# Patient Record
Sex: Male | Born: 1950
Health system: Southern US, Community
[De-identification: ages and names within clinical notes are randomized; demographics above are authoritative.]

## PROBLEM LIST (undated history)

## (undated) DIAGNOSIS — Z5189 Encounter for other specified aftercare: Secondary | ICD-10-CM

## (undated) DIAGNOSIS — J40 Bronchitis, not specified as acute or chronic: Secondary | ICD-10-CM

## (undated) DIAGNOSIS — E7800 Pure hypercholesterolemia, unspecified: Secondary | ICD-10-CM

## (undated) DIAGNOSIS — E119 Type 2 diabetes mellitus without complications: Secondary | ICD-10-CM

## (undated) DIAGNOSIS — I10 Essential (primary) hypertension: Secondary | ICD-10-CM

## (undated) DIAGNOSIS — I639 Cerebral infarction, unspecified: Secondary | ICD-10-CM

## (undated) DIAGNOSIS — G2581 Restless legs syndrome: Secondary | ICD-10-CM

## (undated) DIAGNOSIS — IMO0001 Reserved for inherently not codable concepts without codable children: Secondary | ICD-10-CM

## (undated) DIAGNOSIS — J449 Chronic obstructive pulmonary disease, unspecified: Secondary | ICD-10-CM

## (undated) DIAGNOSIS — G4730 Sleep apnea, unspecified: Secondary | ICD-10-CM

## (undated) HISTORY — PX: EYE SURGERY: SHX253

---

## 2001-04-23 ENCOUNTER — Encounter: Payer: Self-pay | Admitting: Internal Medicine

## 2001-04-23 ENCOUNTER — Ambulatory Visit (HOSPITAL_COMMUNITY): Admission: RE | Admit: 2001-04-23 | Discharge: 2001-04-23 | Payer: Self-pay | Admitting: Internal Medicine

## 2003-10-13 ENCOUNTER — Ambulatory Visit (HOSPITAL_COMMUNITY): Admission: RE | Admit: 2003-10-13 | Discharge: 2003-10-13 | Payer: Self-pay | Admitting: Internal Medicine

## 2003-10-28 ENCOUNTER — Encounter: Admission: RE | Admit: 2003-10-28 | Discharge: 2003-10-28 | Payer: Self-pay | Admitting: Internal Medicine

## 2003-11-12 ENCOUNTER — Encounter: Admission: RE | Admit: 2003-11-12 | Discharge: 2003-11-12 | Payer: Self-pay | Admitting: Internal Medicine

## 2003-12-11 ENCOUNTER — Encounter (HOSPITAL_COMMUNITY): Admission: RE | Admit: 2003-12-11 | Discharge: 2004-01-10 | Payer: Self-pay | Admitting: Neurosurgery

## 2004-12-01 ENCOUNTER — Ambulatory Visit (HOSPITAL_COMMUNITY): Admission: RE | Admit: 2004-12-01 | Discharge: 2004-12-01 | Payer: Self-pay | Admitting: Orthopedic Surgery

## 2004-12-01 ENCOUNTER — Ambulatory Visit (HOSPITAL_BASED_OUTPATIENT_CLINIC_OR_DEPARTMENT_OTHER): Admission: RE | Admit: 2004-12-01 | Discharge: 2004-12-01 | Payer: Self-pay | Admitting: Orthopedic Surgery

## 2005-01-17 ENCOUNTER — Ambulatory Visit: Payer: Self-pay | Admitting: Internal Medicine

## 2005-01-20 ENCOUNTER — Ambulatory Visit: Payer: Self-pay | Admitting: Internal Medicine

## 2005-01-27 ENCOUNTER — Ambulatory Visit (HOSPITAL_COMMUNITY): Admission: RE | Admit: 2005-01-27 | Discharge: 2005-01-27 | Payer: Self-pay | Admitting: Internal Medicine

## 2005-02-27 ENCOUNTER — Ambulatory Visit (HOSPITAL_COMMUNITY): Admission: RE | Admit: 2005-02-27 | Discharge: 2005-02-27 | Payer: Self-pay | Admitting: Internal Medicine

## 2005-04-05 ENCOUNTER — Ambulatory Visit (HOSPITAL_COMMUNITY): Admission: RE | Admit: 2005-04-05 | Discharge: 2005-04-05 | Payer: Self-pay | Admitting: Internal Medicine

## 2005-04-06 ENCOUNTER — Ambulatory Visit: Payer: Self-pay | Admitting: Internal Medicine

## 2005-04-20 ENCOUNTER — Encounter: Admission: RE | Admit: 2005-04-20 | Discharge: 2005-04-20 | Payer: Self-pay | Admitting: Internal Medicine

## 2005-04-28 ENCOUNTER — Ambulatory Visit (HOSPITAL_COMMUNITY): Admission: RE | Admit: 2005-04-28 | Discharge: 2005-04-28 | Payer: Self-pay | Admitting: Internal Medicine

## 2005-05-03 ENCOUNTER — Ambulatory Visit (HOSPITAL_COMMUNITY): Admission: RE | Admit: 2005-05-03 | Discharge: 2005-05-03 | Payer: Self-pay | Admitting: Internal Medicine

## 2005-05-04 ENCOUNTER — Encounter: Admission: RE | Admit: 2005-05-04 | Discharge: 2005-05-04 | Payer: Self-pay | Admitting: Internal Medicine

## 2005-07-06 ENCOUNTER — Encounter: Admission: RE | Admit: 2005-07-06 | Discharge: 2005-07-06 | Payer: Self-pay | Admitting: Orthopedic Surgery

## 2005-08-10 ENCOUNTER — Inpatient Hospital Stay (HOSPITAL_COMMUNITY): Admission: RE | Admit: 2005-08-10 | Discharge: 2005-08-15 | Payer: Self-pay | Admitting: Orthopedic Surgery

## 2005-08-10 HISTORY — PX: TOTAL HIP ARTHROPLASTY: SHX124

## 2005-08-25 ENCOUNTER — Inpatient Hospital Stay (HOSPITAL_COMMUNITY): Admission: EM | Admit: 2005-08-25 | Discharge: 2005-08-31 | Payer: Self-pay | Admitting: Emergency Medicine

## 2005-08-27 ENCOUNTER — Ambulatory Visit: Payer: Self-pay | Admitting: Infectious Diseases

## 2005-08-28 ENCOUNTER — Encounter: Payer: Self-pay | Admitting: Orthopedic Surgery

## 2005-09-01 ENCOUNTER — Ambulatory Visit: Payer: Self-pay | Admitting: Internal Medicine

## 2005-09-03 ENCOUNTER — Emergency Department (HOSPITAL_COMMUNITY): Admission: EM | Admit: 2005-09-03 | Discharge: 2005-09-04 | Payer: Self-pay | Admitting: Emergency Medicine

## 2005-09-13 ENCOUNTER — Ambulatory Visit: Payer: Self-pay | Admitting: Internal Medicine

## 2005-09-14 ENCOUNTER — Ambulatory Visit: Payer: Self-pay | Admitting: Emergency Medicine

## 2005-09-14 ENCOUNTER — Inpatient Hospital Stay (HOSPITAL_COMMUNITY): Admission: EM | Admit: 2005-09-14 | Discharge: 2005-09-25 | Payer: Self-pay | Admitting: Emergency Medicine

## 2005-09-15 ENCOUNTER — Encounter (INDEPENDENT_AMBULATORY_CARE_PROVIDER_SITE_OTHER): Payer: Self-pay | Admitting: *Deleted

## 2005-09-20 ENCOUNTER — Ambulatory Visit: Payer: Self-pay | Admitting: Cardiology

## 2005-09-20 ENCOUNTER — Encounter: Payer: Self-pay | Admitting: Cardiology

## 2005-09-22 ENCOUNTER — Ambulatory Visit: Payer: Self-pay | Admitting: Internal Medicine

## 2005-10-05 ENCOUNTER — Ambulatory Visit: Payer: Self-pay | Admitting: Internal Medicine

## 2005-10-17 ENCOUNTER — Ambulatory Visit: Payer: Self-pay | Admitting: Internal Medicine

## 2005-10-27 ENCOUNTER — Encounter: Admission: RE | Admit: 2005-10-27 | Discharge: 2005-10-27 | Payer: Self-pay | Admitting: Orthopedic Surgery

## 2005-11-30 HISTORY — PX: TOTAL HIP ARTHROPLASTY: SHX124

## 2005-12-14 ENCOUNTER — Inpatient Hospital Stay (HOSPITAL_COMMUNITY): Admission: RE | Admit: 2005-12-14 | Discharge: 2005-12-18 | Payer: Self-pay | Admitting: Orthopedic Surgery

## 2006-01-31 ENCOUNTER — Encounter (HOSPITAL_COMMUNITY): Admission: RE | Admit: 2006-01-31 | Discharge: 2006-03-02 | Payer: Self-pay | Admitting: Orthopedic Surgery

## 2006-02-05 ENCOUNTER — Ambulatory Visit: Payer: Self-pay | Admitting: Pulmonary Disease

## 2006-02-06 ENCOUNTER — Encounter: Admission: RE | Admit: 2006-02-06 | Discharge: 2006-02-06 | Payer: Self-pay | Admitting: Internal Medicine

## 2006-02-11 ENCOUNTER — Ambulatory Visit: Admission: RE | Admit: 2006-02-11 | Discharge: 2006-02-11 | Payer: Self-pay | Admitting: Internal Medicine

## 2006-03-16 ENCOUNTER — Ambulatory Visit: Admission: RE | Admit: 2006-03-16 | Discharge: 2006-03-16 | Payer: Self-pay | Admitting: Orthopedic Surgery

## 2006-04-18 ENCOUNTER — Ambulatory Visit: Payer: Self-pay | Admitting: Pulmonary Disease

## 2006-05-28 ENCOUNTER — Ambulatory Visit: Payer: Self-pay | Admitting: Pulmonary Disease

## 2006-08-10 ENCOUNTER — Ambulatory Visit (HOSPITAL_COMMUNITY): Admission: RE | Admit: 2006-08-10 | Discharge: 2006-08-10 | Payer: Self-pay | Admitting: Internal Medicine

## 2006-08-17 ENCOUNTER — Ambulatory Visit (HOSPITAL_COMMUNITY): Admission: RE | Admit: 2006-08-17 | Discharge: 2006-08-17 | Payer: Self-pay | Admitting: Internal Medicine

## 2006-09-09 IMAGING — CR DG CHEST 1V PORT
1 series · 1 of 1 positions shown · non-contrast
Comparison: none

CLINICAL DATA: Anaphylaxis.  
PORTABLE CHEST ? 1 VIEW ? 09/19/05 ? 8888 HOURS:

[view not recorded]
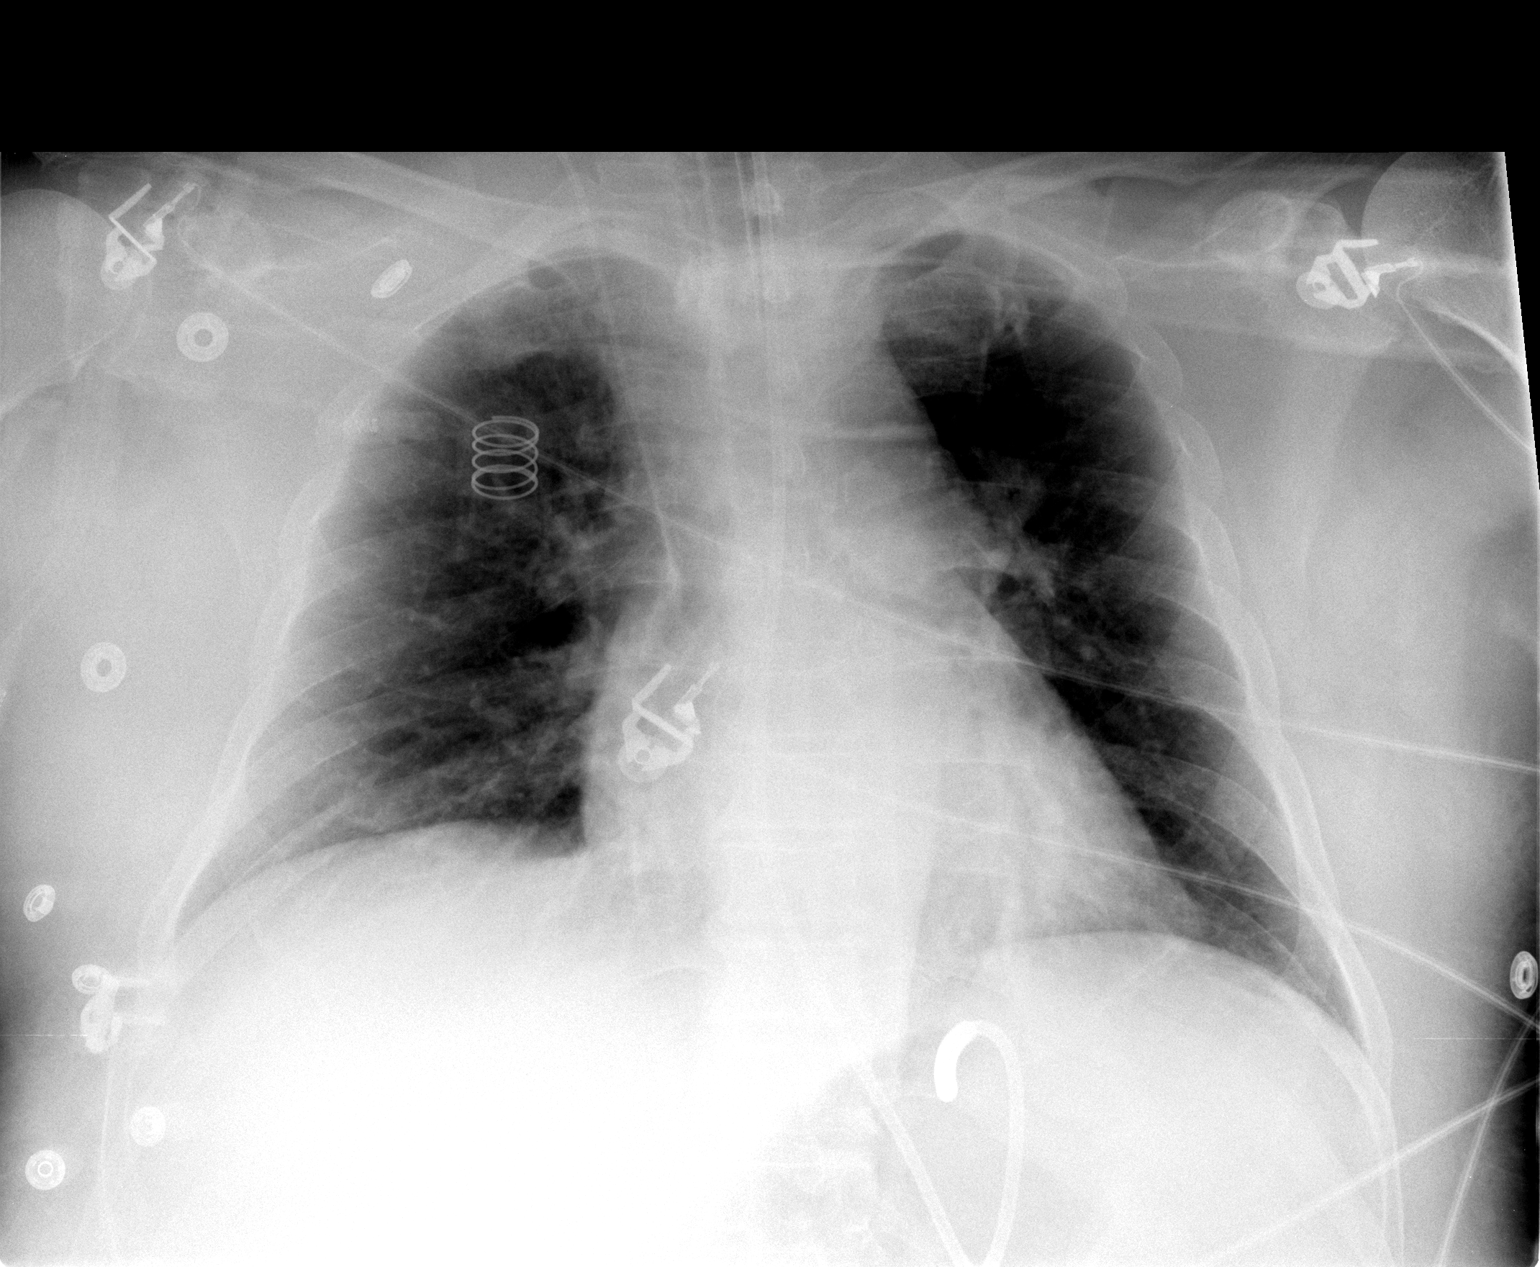

[1 of 1 positions shown; findings below may reference images not displayed]

FINDINGS: Endotracheal tube tip is 5.7 cm above the carina.  Panda tube tip curled within the gastric fundus.  Right central line tip mid to distal superior vena cava.  No pneumothorax.  Cardiomegaly and central pulmonary vascular prominence.  No segmental region of consolidation.
IMPRESSION: Cardiomegaly and mild pulmonary vascular prominence appearing similar to prior exam.

## 2007-02-15 ENCOUNTER — Telehealth (INDEPENDENT_AMBULATORY_CARE_PROVIDER_SITE_OTHER): Payer: Self-pay | Admitting: *Deleted

## 2007-02-15 DIAGNOSIS — G4733 Obstructive sleep apnea (adult) (pediatric): Secondary | ICD-10-CM | POA: Insufficient documentation

## 2007-03-18 ENCOUNTER — Telehealth (INDEPENDENT_AMBULATORY_CARE_PROVIDER_SITE_OTHER): Payer: Self-pay | Admitting: *Deleted

## 2007-04-17 ENCOUNTER — Telehealth (INDEPENDENT_AMBULATORY_CARE_PROVIDER_SITE_OTHER): Payer: Self-pay | Admitting: *Deleted

## 2007-05-31 ENCOUNTER — Ambulatory Visit: Payer: Self-pay | Admitting: Pulmonary Disease

## 2007-05-31 DIAGNOSIS — G2581 Restless legs syndrome: Secondary | ICD-10-CM

## 2007-12-06 ENCOUNTER — Ambulatory Visit: Payer: Self-pay | Admitting: Pulmonary Disease

## 2010-02-20 ENCOUNTER — Encounter: Payer: Self-pay | Admitting: Internal Medicine

## 2010-03-09 ENCOUNTER — Other Ambulatory Visit (HOSPITAL_COMMUNITY): Payer: Self-pay | Admitting: Internal Medicine

## 2010-03-09 ENCOUNTER — Encounter (HOSPITAL_COMMUNITY): Payer: Self-pay

## 2010-03-09 ENCOUNTER — Ambulatory Visit (HOSPITAL_COMMUNITY)
Admission: RE | Admit: 2010-03-09 | Discharge: 2010-03-09 | Disposition: A | Discharge status: Home / Self Care | Payer: Medicare Other | Source: Ambulatory Visit | Attending: Internal Medicine | Admitting: Internal Medicine

## 2010-03-09 DIAGNOSIS — R059 Cough, unspecified: Secondary | ICD-10-CM | POA: Insufficient documentation

## 2010-03-09 DIAGNOSIS — J449 Chronic obstructive pulmonary disease, unspecified: Secondary | ICD-10-CM | POA: Insufficient documentation

## 2010-03-09 DIAGNOSIS — R05 Cough: Secondary | ICD-10-CM

## 2010-03-09 DIAGNOSIS — J4489 Other specified chronic obstructive pulmonary disease: Secondary | ICD-10-CM | POA: Insufficient documentation

## 2010-03-09 HISTORY — DX: Essential (primary) hypertension: I10

## 2010-04-08 ENCOUNTER — Emergency Department (HOSPITAL_COMMUNITY): Payer: Medicare Other

## 2010-04-08 ENCOUNTER — Emergency Department (HOSPITAL_COMMUNITY)
Admission: EM | Admit: 2010-04-08 | Discharge: 2010-04-09 | Disposition: A | Discharge status: Other Acute Inpatient Hospital | Payer: Medicare Other | Source: Home / Self Care | Attending: Emergency Medicine | Admitting: Emergency Medicine

## 2010-04-08 DIAGNOSIS — S2249XA Multiple fractures of ribs, unspecified side, initial encounter for closed fracture: Secondary | ICD-10-CM | POA: Insufficient documentation

## 2010-04-08 DIAGNOSIS — I10 Essential (primary) hypertension: Secondary | ICD-10-CM | POA: Insufficient documentation

## 2010-04-08 DIAGNOSIS — R079 Chest pain, unspecified: Secondary | ICD-10-CM | POA: Insufficient documentation

## 2010-04-08 DIAGNOSIS — R0602 Shortness of breath: Secondary | ICD-10-CM | POA: Insufficient documentation

## 2010-04-08 DIAGNOSIS — IMO0002 Reserved for concepts with insufficient information to code with codable children: Secondary | ICD-10-CM | POA: Insufficient documentation

## 2010-04-08 DIAGNOSIS — H1130 Conjunctival hemorrhage, unspecified eye: Secondary | ICD-10-CM | POA: Insufficient documentation

## 2010-04-08 DIAGNOSIS — Y929 Unspecified place or not applicable: Secondary | ICD-10-CM | POA: Insufficient documentation

## 2010-04-08 DIAGNOSIS — S27329A Contusion of lung, unspecified, initial encounter: Secondary | ICD-10-CM | POA: Insufficient documentation

## 2010-04-08 DIAGNOSIS — M542 Cervicalgia: Secondary | ICD-10-CM | POA: Insufficient documentation

## 2010-04-08 DIAGNOSIS — S066X0A Traumatic subarachnoid hemorrhage without loss of consciousness, initial encounter: Secondary | ICD-10-CM | POA: Insufficient documentation

## 2010-04-08 DIAGNOSIS — S0510XA Contusion of eyeball and orbital tissues, unspecified eye, initial encounter: Secondary | ICD-10-CM | POA: Insufficient documentation

## 2010-04-08 DIAGNOSIS — S12100A Unspecified displaced fracture of second cervical vertebra, initial encounter for closed fracture: Secondary | ICD-10-CM | POA: Insufficient documentation

## 2010-04-08 DIAGNOSIS — S20219A Contusion of unspecified front wall of thorax, initial encounter: Secondary | ICD-10-CM | POA: Insufficient documentation

## 2010-04-08 LAB — DIFFERENTIAL
Basophils Absolute: 0 10*3/uL (ref 0.0–0.1)
Basophils Relative: 0 % (ref 0–1)
Eosinophils Absolute: 0.1 10*3/uL (ref 0.0–0.7)
Eosinophils Relative: 1 % (ref 0–5)
Lymphocytes Relative: 16 % (ref 12–46)

## 2010-04-08 LAB — BASIC METABOLIC PANEL
Calcium: 8.6 mg/dL (ref 8.4–10.5)
Creatinine, Ser: 0.96 mg/dL (ref 0.4–1.5)
GFR calc Af Amer: >60 mL/min (ref >60)
GFR calc non Af Amer: >60 mL/min (ref >60)
Sodium: 139 mEq/L (ref 135–145)

## 2010-04-08 LAB — CBC
MCHC: 33 g/dL (ref 30.0–36.0)
Platelets: 240 10*3/uL (ref 150–400)
RDW: 15.2 % (ref 11.5–15.5)
WBC: 17 10*3/uL — ABNORMAL HIGH (ref 4.0–10.5)

## 2010-04-08 MED ORDER — IOHEXOL 300 MG/ML  SOLN
100.0000 mL | Freq: Once | INTRAMUSCULAR | Status: AC | PRN
  Administered 2010-04-08: 100 mL via INTRAVENOUS

## 2010-04-09 ENCOUNTER — Inpatient Hospital Stay (HOSPITAL_COMMUNITY): Payer: Medicare Other

## 2010-04-09 ENCOUNTER — Inpatient Hospital Stay (HOSPITAL_COMMUNITY)
Admission: EM | Admit: 2010-04-09 | Discharge: 2010-04-13 | DRG: 964 | Disposition: A | Discharge status: Home / Self Care | Payer: Medicare Other | Source: Other Acute Inpatient Hospital | Attending: General Surgery | Admitting: General Surgery

## 2010-04-09 DIAGNOSIS — F1010 Alcohol abuse, uncomplicated: Secondary | ICD-10-CM | POA: Diagnosis present

## 2010-04-09 DIAGNOSIS — S0083XA Contusion of other part of head, initial encounter: Secondary | ICD-10-CM | POA: Diagnosis present

## 2010-04-09 DIAGNOSIS — L255 Unspecified contact dermatitis due to plants, except food: Secondary | ICD-10-CM | POA: Diagnosis present

## 2010-04-09 DIAGNOSIS — Z7982 Long term (current) use of aspirin: Secondary | ICD-10-CM

## 2010-04-09 DIAGNOSIS — Z96649 Presence of unspecified artificial hip joint: Secondary | ICD-10-CM

## 2010-04-09 DIAGNOSIS — S12100A Unspecified displaced fracture of second cervical vertebra, initial encounter for closed fracture: Secondary | ICD-10-CM | POA: Diagnosis present

## 2010-04-09 DIAGNOSIS — S066X0A Traumatic subarachnoid hemorrhage without loss of consciousness, initial encounter: Principal | ICD-10-CM | POA: Diagnosis present

## 2010-04-09 DIAGNOSIS — S2249XA Multiple fractures of ribs, unspecified side, initial encounter for closed fracture: Secondary | ICD-10-CM | POA: Diagnosis present

## 2010-04-09 DIAGNOSIS — E785 Hyperlipidemia, unspecified: Secondary | ICD-10-CM | POA: Diagnosis present

## 2010-04-09 DIAGNOSIS — S0003XA Contusion of scalp, initial encounter: Secondary | ICD-10-CM | POA: Diagnosis present

## 2010-04-09 DIAGNOSIS — Z8673 Personal history of transient ischemic attack (TIA), and cerebral infarction without residual deficits: Secondary | ICD-10-CM

## 2010-04-09 DIAGNOSIS — T622X1A Toxic effect of other ingested (parts of) plant(s), accidental (unintentional), initial encounter: Secondary | ICD-10-CM | POA: Diagnosis present

## 2010-04-09 DIAGNOSIS — S27329A Contusion of lung, unspecified, initial encounter: Secondary | ICD-10-CM | POA: Diagnosis present

## 2010-04-09 DIAGNOSIS — E119 Type 2 diabetes mellitus without complications: Secondary | ICD-10-CM | POA: Diagnosis present

## 2010-04-09 DIAGNOSIS — S32009A Unspecified fracture of unspecified lumbar vertebra, initial encounter for closed fracture: Secondary | ICD-10-CM | POA: Diagnosis present

## 2010-04-09 DIAGNOSIS — K219 Gastro-esophageal reflux disease without esophagitis: Secondary | ICD-10-CM | POA: Diagnosis present

## 2010-04-09 DIAGNOSIS — I10 Essential (primary) hypertension: Secondary | ICD-10-CM | POA: Diagnosis present

## 2010-04-09 DIAGNOSIS — D62 Acute posthemorrhagic anemia: Secondary | ICD-10-CM | POA: Diagnosis not present

## 2010-04-09 DIAGNOSIS — G4733 Obstructive sleep apnea (adult) (pediatric): Secondary | ICD-10-CM | POA: Diagnosis present

## 2010-04-09 LAB — CBC
Hemoglobin: 11.8 g/dL — ABNORMAL LOW (ref 13.0–17.0)
MCH: 27.9 pg (ref 26.0–34.0)
Platelets: 212 10*3/uL (ref 150–400)
RBC: 4.23 MIL/uL (ref 4.22–5.81)
WBC: 11.4 10*3/uL — ABNORMAL HIGH (ref 4.0–10.5)

## 2010-04-09 LAB — HEMOGLOBIN A1C
Hgb A1c MFr Bld: 6.6 % — ABNORMAL HIGH (ref <5.7)
Mean Plasma Glucose: 143 mg/dL — ABNORMAL HIGH (ref <117)

## 2010-04-09 LAB — GLUCOSE, CAPILLARY: Glucose-Capillary: 113 mg/dL — ABNORMAL HIGH (ref 70–99)

## 2010-04-09 LAB — BASIC METABOLIC PANEL
CO2: 25 mEq/L (ref 19–32)
Chloride: 109 mEq/L (ref 96–112)
Creatinine, Ser: 0.73 mg/dL (ref 0.4–1.5)
GFR calc Af Amer: >60 mL/min (ref >60)
Potassium: 3.6 mEq/L (ref 3.5–5.1)

## 2010-04-09 LAB — URINALYSIS, ROUTINE W REFLEX MICROSCOPIC
Bilirubin Urine: NEGATIVE
Glucose, UA: NEGATIVE mg/dL
Ketones, ur: NEGATIVE mg/dL
Ketones, ur: 15 mg/dL — AB
Leukocytes, UA: NEGATIVE
Leukocytes, UA: NEGATIVE
Nitrite: NEGATIVE
Nitrite: NEGATIVE
Protein, ur: 30 mg/dL — AB
Protein, ur: NEGATIVE mg/dL
pH: 5.5 (ref 5.0–8.0)

## 2010-04-09 LAB — URINE MICROSCOPIC-ADD ON

## 2010-04-09 LAB — PROTIME-INR: INR: 0.89 (ref 0.00–1.49)

## 2010-04-10 LAB — GLUCOSE, CAPILLARY: Glucose-Capillary: 127 mg/dL — ABNORMAL HIGH (ref 70–99)

## 2010-04-11 ENCOUNTER — Inpatient Hospital Stay (HOSPITAL_COMMUNITY): Payer: Medicare Other

## 2010-04-11 LAB — BASIC METABOLIC PANEL
CO2: 25 mEq/L (ref 19–32)
GFR calc non Af Amer: >60 mL/min (ref >60)
Glucose, Bld: 123 mg/dL — ABNORMAL HIGH (ref 70–99)
Potassium: 3.3 mEq/L — ABNORMAL LOW (ref 3.5–5.1)
Sodium: 140 mEq/L (ref 135–145)

## 2010-04-11 LAB — CBC
HCT: 34 % — ABNORMAL LOW (ref 39.0–52.0)
Hemoglobin: 11.3 g/dL — ABNORMAL LOW (ref 13.0–17.0)
RDW: 15.1 % (ref 11.5–15.5)
WBC: 8 10*3/uL (ref 4.0–10.5)

## 2010-04-11 LAB — GLUCOSE, CAPILLARY
Glucose-Capillary: 137 mg/dL — ABNORMAL HIGH (ref 70–99)
Glucose-Capillary: 124 mg/dL — ABNORMAL HIGH (ref 70–99)
Glucose-Capillary: 104 mg/dL — ABNORMAL HIGH (ref 70–99)
Glucose-Capillary: 168 mg/dL — ABNORMAL HIGH (ref 70–99)

## 2010-04-12 ENCOUNTER — Inpatient Hospital Stay (HOSPITAL_COMMUNITY): Payer: Medicare Other

## 2010-04-12 LAB — GLUCOSE, CAPILLARY
Glucose-Capillary: 144 mg/dL — ABNORMAL HIGH (ref 70–99)
Glucose-Capillary: 122 mg/dL — ABNORMAL HIGH (ref 70–99)
Glucose-Capillary: 121 mg/dL — ABNORMAL HIGH (ref 70–99)
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
## 2010-04-13 ENCOUNTER — Inpatient Hospital Stay (HOSPITAL_COMMUNITY): Payer: Medicare Other

## 2010-04-13 LAB — GLUCOSE, CAPILLARY
Glucose-Capillary: 136 mg/dL — ABNORMAL HIGH (ref 70–99)
Glucose-Capillary: 136 mg/dL — ABNORMAL HIGH (ref 70–99)
Glucose-Capillary: 132 mg/dL — ABNORMAL HIGH (ref 70–99)
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
## 2010-04-28 ENCOUNTER — Other Ambulatory Visit: Payer: Self-pay | Admitting: Neurosurgery

## 2010-04-28 ENCOUNTER — Ambulatory Visit
Admission: RE | Admit: 2010-04-28 | Discharge: 2010-04-28 | Disposition: A | Discharge status: Home / Self Care | Payer: Medicare Other | Source: Ambulatory Visit | Attending: Neurosurgery | Admitting: Neurosurgery

## 2010-04-28 DIAGNOSIS — S12100A Unspecified displaced fracture of second cervical vertebra, initial encounter for closed fracture: Secondary | ICD-10-CM
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
## 2010-05-19 ENCOUNTER — Ambulatory Visit
Admission: RE | Admit: 2010-05-19 | Discharge: 2010-05-19 | Disposition: A | Discharge status: Home / Self Care | Payer: Medicare Other | Source: Ambulatory Visit | Attending: Neurosurgery | Admitting: Neurosurgery

## 2010-05-19 ENCOUNTER — Other Ambulatory Visit: Payer: Self-pay | Admitting: Neurosurgery

## 2010-05-19 DIAGNOSIS — M542 Cervicalgia: Secondary | ICD-10-CM

## 2010-06-16 ENCOUNTER — Ambulatory Visit
Admission: RE | Admit: 2010-06-16 | Discharge: 2010-06-16 | Disposition: A | Discharge status: Home / Self Care | Payer: Medicare Other | Source: Ambulatory Visit | Attending: Neurosurgery | Admitting: Neurosurgery

## 2010-06-16 DIAGNOSIS — M542 Cervicalgia: Secondary | ICD-10-CM

## 2010-06-17 NOTE — Op Note (Signed)
NAME:  GLEN, KESINGER NO.:  [id number]   MEDICAL RECORD NO.:  [id number]          PATIENT TYPE:  AMB   LOCATION:  DAY                           FACILITY:  [facility]   PHYSICIAN:  Lionel December, M.D.    DATE OF BIRTH:  13-Feb-1950   DATE OF PROCEDURE:  DATE OF DISCHARGE:                                 OPERATIVE REPORT   PROCEDURE:  Colonoscopy with terminal ileoscopy.   INDICATION:  Lucas Clark is 60 year old Caucasian male who was recently found to  have heme-positive stools. He has had chronic diarrhea of at least four  years duration, but has declined lined evaluation until now. He states he  takes 4 to 5 tablets of OTC Imodium b.i.d. to control his frequency of bowel  movements so that he can work. He states just before he was noted to have  heme-positive stools. He did have hematochezia. He denies frank and rectal  bleeding or melena. Procedures were reviewed the patient, informed consent  was obtained.   MEDICINES FOR CONSCIOUS SEDATION:  Demerol 50 mg IV, Versed 8 mg IV.   FINDINGS:  Procedure performed in endoscopy suite. The patient's vital signs  and O2 saturation were monitored during the procedure and remained stable.  The patient was placed in the left lateral position and rectal examination  performed. No abnormality noted on external or digital exam. Olympus  videoscope was placed in rectum and advanced under vision into sigmoid colon  and beyond. Preparation was excellent. Scope was passed into cecum which was  identified by appendiceal orifice and ileocecal valve. A short segment of TI  was also examined and was normal. As the scope was withdrawn, colonic mucosa  was carefully examined, biopsies were taken in a random fashion from  descending and sigmoid colon and submitted in one container. There there  were focal areas of erythema and edema in the sigmoid colon. Biopsy was  taken from these areas and submitted in a separate container. Rectal mucosa  was  normal. Scope was retroflexed to examine anorectal junction which was  unremarkable. Endoscope was straightened and withdrawn. The patient  tolerated the procedure well.   FINAL DIAGNOSIS:  1.  Normal terminal ileum.  2.  Focal or patchy erythema at sigmoid colon, but no erosions or ulcers      noted. I am not sure that this is mild colitis; and I doubt very much      that this will explain his chronic diarrhea. Biopsy taken from these      areas as well as normal appearing mucosa and submitted in separate      containers.   RECOMMENDATIONS:  1.  Levbid 1 tablet every morning.  2.  Fiber of choice 2 tablets at bedtime.  3.  He can continue his Imodium like he is.  As soon as soon as his stools      are formed of semi-formed, I would like      for him to gradually decrease the dose of Imodium and see how he does.  4.  He will keep a stool diary.  5.  I will be contacting with biopsy results and further recommendations.      Lionel December, M.D.  Electronically Signed     NR/MEDQ  D:  01/27/2005  T:  01/27/2005  Job:  [id number]   cc:   Kingsley Callander. Ouida Sills, MD  Fax: [phone]

## 2010-06-17 NOTE — Discharge Summary (Signed)
NAME:  Lucas Clark, ROCKETT NO.:  [id number]   MEDICAL RECORD NO.:  [id number]          PATIENT TYPE:  INP   LOCATION:  5015                         FACILITY:  [facility]   PHYSICIAN:  Loreta Ave, M.D. DATE OF BIRTH:  1950/05/14   DATE OF ADMISSION:  08/10/2005  DATE OF DISCHARGE:  08/15/2005                                 DISCHARGE SUMMARY   FINAL DIAGNOSES:  1. 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  2. Hypopotassemia.   HISTORY OF PRESENT ILLNESS:  A 60 year old white male with history of end-  stage DJD and AVN right hip and chronic pain presents to our office for  preop evaluation for a total hip replacement.  He had progressively  worsening pain with failed response to conservative treatment.  Significant  decrease in his daily activities due to the ongoing complaint.   HOSPITAL COURSE:  August 10, 2005 the patient was taken to the [facility]  Operating Room and a right total hip replacement procedure performed.  Surgeon Loreta Ave, M.D. and assistant Genene Churn. Barry Dienes, PA-C.  Anesthesia general.  No surgical specimens.  EBL 500 mL.  There were no  surgical or anesthesia complications and the patient was transferred to  recovery in stable condition.  Pharmacy protocol Coumadin started.  August 11, 2005 the patient complained of headaches from the Dilaudid PCA.  Good pain  control right hip.  No complaints of chest pain or shortness of breath.  WBC  16.9, hemoglobin 12.9, hematocrit 37.5, platelets 295, glucose 175, INR 1.1.  The wound looked good, staples intact.  Calf nontender, neurovascularly  intact distally.  Discontinued Dilaudid PCA.  Dressing change.  August 12, 2005 the patient doing well with good pain control.  No complaints of chest  pain or shortness of breath or headaches.  Vital signs stable, afebrile.  The wound looked good with staples intact.  Moderate serous drainage but no  signs  of infection.  Calf nontender, neurovascularly intact.  Discontinued  Foley and hep-locked IV.  INR 1.1.  August 13, 2005 the patient doing well  with good pain control right hip.  No BM yet but positive flatus.  No  complaints of chest pain, shortness of breath, abdominal pain.  Progressing  well with PT.  Vital signs stable, afebrile.  Hemoglobin 10.7, potassium  2.6, sodium 133, chloride 90, CO2 34, BUN 9, creatinine 0.8, glucose 174,  INR 2.1.  Wound looked okay with mild to moderate serous drainage.  Again,  no signs of infection.  Staples intact.  Calf nontender, neurovascularly  intact.  Abdomen round, nontender and firm.  Magnesium citrate ordered.  KCl  60 mEq elixir x1 dose and then repeat labs.  Give another dose of potassium  40 mEq later if potassium still low.  August 14, 2005 doing well with good  pain control.  Good bowel movement.  No complaints of chest pain, shortness  of breath, abdominal pain.  Progressing with PT.  Temperature 99.3, pulse  81, respirations 16, blood pressure  124/77.  Hemoglobin 10.7, sodium 136,  potassium 2.9, chloride 93, CO2 33, BUN 12, creatinine 0.8, glucose 157, INR  pending.  Wound looked good.  Staples intact.  No signs of infection.  Calf  nontender.  Neurovascularly intact.  Abdomen soft, nontender and round.  He  continues with hypokalemia.  Ordered a medicine consult.  Checked a  hemoglobin A1c.  August 15, 2005 the patient doing well with no specific  complaints.  Vital signs stable, afebrile.  Hemoglobin 11.2, glucose 153,  INR 2.6, hemoglobin A1c 6.5.  Wound looked good, staples intact.  Again,  moderate serous drainage noted but no obvious infection.  Calf nontender,  neurovascularly intact.  Potassium normal.  He was cleared by medicine to  discharge home.   CONDITION:  Good and stable.   DISPOSITION ON DISCHARGE:  Home.   MEDICATIONS:  1. Percocet 5/325 one or two tabs p.o. q.4-6 h. p.r.n. for pain.  2. Robaxin 500 mg one tab p.o. q.6  h. p.r.n. for spasms.  3. Coumadin pharmacy protocol dose to be monitored by home health RN.  4. KCl 20 mEq p.o. daily.  5. Resume previous home meds.   INSTRUCTIONS:  The patient will work with home health PT and OT to improve  ambulation and strengthening.  Touchdown weightbearing until he is 6 weeks  postop.  Daily dressing changes as needed.  Coumadin x4 weeks postop for DVT  prophylaxis.  Follow up when he is 2 weeks postop for recheck.  Return  sooner if needed.      Genene Churn. Denton Meek.      Loreta Ave, M.D.  Electronically Signed    JMO/MEDQ  D:  11/15/2005  T:  11/15/2005  Job:  [id number]

## 2010-06-17 NOTE — Op Note (Signed)
NAMESHAYAAN, PARKE NO.:  [id number]   MEDICAL RECORD NO.:  [id number]          PATIENT TYPE:  AMB   LOCATION:  DSC                          FACILITY:  [facility]   PHYSICIAN:  Cindee Salt, M.D.       DATE OF BIRTH:  1950-04-12   DATE OF PROCEDURE:  12/01/2004  DATE OF DISCHARGE:                                 OPERATIVE REPORT   PREOPERATIVE DIAGNOSIS:  Carpal tunnel syndrome, left hand.   POSTOPERATIVE DIAGNOSIS:  Carpal tunnel syndrome, left hand.   OPERATION:  Decompression of left median nerve.   SURGEON:  Cindee Salt, M.D.   ASSISTANT:  None.   ANESTHESIA:  Forearm-based IV regional.   HISTORY:  The patient is a 60 year old male with a history of carpal tunnel  syndrome, EMG and nerve conductions positive, which has not responded to  conservative treatment.   PROCEDURE:  The patient was brought to the operating room where a forearm-  based IV regional anesthetic was carried out without difficulty.  He was  prepped using DuraPrep, supine position, left arm free.  A longitudinal  incision was made in the palm and carried down through subcutaneous tissue.  Bleeders were electrocauterized.  Palmar fascia was split, superficial  palmar arch identified, the flexor tendon to the ring and little finger  identified.  To the ulnar side of median nerve, the carpal retinaculum was  incised with sharp dissection.  A right-angle and Sewall retractor was  placed between skin and forearm fascia.  The fascia was released for  approximately a centimeter and a half proximal to the wrist crease under  direct vision.  The canal was explored.  Area of hyperemia to the nerve was  apparent.  No further lesions were identified.  Tenosynovial tissue was  moderately thickened.  The wound was irrigated and the skin was then closed  with interrupted 5-0 nylon sutures.  A sterile compressive dressing and  splint were applied.  The patient tolerated the procedure well was taken to  the recovery room for observation in satisfactory condition.  He is  discharged home to return to the [facility] [place] in 1 week on  Vicodin.           ______________________________  Cindee Salt, M.D.     GK/MEDQ  D:  12/01/2004  T:  12/02/2004  Job:  [id number]

## 2010-06-17 NOTE — Op Note (Signed)
NAME:  Lucas Clark, Lucas Clark NO.:  [id number]   MEDICAL RECORD NO.:  [id number]          PATIENT TYPE:  INP   LOCATION:  5015                         FACILITY:  [facility]   PHYSICIAN:  Loreta Ave, M.D. DATE OF BIRTH:  Sep 15, 1950   DATE OF PROCEDURE:  08/10/2005  DATE OF DISCHARGE:                                 OPERATIVE REPORT   PREOPERATIVE DIAGNOSES:  1.  End-stage degenerative arthritis, right hip, secondary to avascular      necrosis.  2.  Exogenous obesity.   POSTOPERATIVE DIAGNOSES:  1.  End-stage degenerative arthritis, right hip, secondary to avascular      necrosis.  2.  Exogenous obesity.   OPERATIVE PROCEDURE:  Right total hip replacement.  [employer]  Accolade prosthesis.  Press-Fit 52-mm acetabular component with a 32-mm  internal diameter ceramic component.  Femoral component size #3 with a 132-  degree neck angle, 35-mm neck, and a +4 x 32-mm ceramic head.   SURGEON:  Loreta Ave, MD   ASSISTANT:  Genene Churn. Barry Dienes, [place], present throughout the case.   ANESTHESIA:  General.   BLOOD LOSS:  500 mL   BLOOD GIVEN:  None.   SPECIMENS:  None.   CULTURES:  None.   COMPLICATIONS:  None.   DRESSING:  Soft compressive.   PROCEDURE:  Of note, because of the patient's exogenous obesity, all  positioning and the entire procedure was made much more difficult.  After  adequate anesthesia, turned to a lateral position, appropriate padding and  support.  Prepped and draped in the usual sterile fashion.  Incision along  the shaft of the femur extending posterosuperior.  Skin, subcutaneous tissue  divided.  Iliotibial band exposed, incised, Charnley retractor put in place.  Very difficult to keep the retractor in place because of his size, and it  kept coming out.  Adequate exposure, however, was achieved.  External  rotator capsule was taken down off the back of the intertrochanteric groove  of the femur.  Neurovascular structures identified  and protected.  Hip  dislocated posteriorly.  Femoral neck cut, longer than usual because of the  shape of his femoral neck.  This was at about 1-1/2 fingerbreadths above the  lesser trochanter in line with the definitive component.  Head removed.  Complete delamination, grade 4 changes from AVN.  Acetabulum exposed, with  degenerative changes there, as well.  Sequential reaming.  Appropriate  medial inferior placement.  Bleeding __________ throughout.  Sized for a 52-  mm component, which was hammered in place at 45 degrees of abduction, 20  degrees of anteversion.  Good capturing and fixation, which was augmented  with 2 screws through the cup, predrilled.  A 32-mm internal diameter  ceramic insert was then put in place.  Attention turned to the femur.  Reamed up to good fitting, choosing a #3 component.  After appropriate  trials with different neck lengths, different neck angles, and different  balls, I chose the 132-degree, 35-mm neck, +4 x 32-mm head.  With this, I  had a congruent stable reduction of flexion and extension, equal  leg  lengths, and good appearance.  Once the trials were completed, the  definitive component was seated.  The 32-mm ceramic head attached.  Hip  reduced.  Wound irrigated.  Iliotibial band was closed after the external  rotator and capsule repaired the back of the intertrochanteric groove  through drill holes using 5-0 wire and tied over a bony bridge.  Iliotibial  band closed with #1 Vicryl.  Skin and subcutaneous tissue with Vicryl and  staples.  __________ injection with Marcaine.  Sterile compressive dressing  applied.  Returned to supine position.  Abduction pillow placed.  Anesthesia  reversed.  Brought to recovery room.  Tolerated surgery well without  complications.      Loreta Ave, M.D.  Electronically Signed     DFM/MEDQ  D:  08/10/2005  T:  08/11/2005  Job:  [id number]

## 2010-06-17 NOTE — Assessment & Plan Note (Signed)
[facility]                             PULMONARY OFFICE NOTE   RAYLON, LAMSON                      MRN:          [id number]  DATE:04/18/2006                            DOB:          1950-10-18    SLEEP MEDICINE CONSULTATION   HISTORY OF PRESENT ILLNESS:  The patient is a 60 year old gentleman whom  I have been asked to see for obstructive sleep apnea.  The patient  underwent nocturnal polysomnography on February 11, 2006 where, by split-  night protocol, he was found to have very severe obstructive sleep apnea  with a respiratory disturbance index of 136 events per hour in the first  half of the night with O2 desaturation as low as 84%.  The patient was  then placed on CPAP and titrated to a final pressure of 10 cm with what  appears to be excellent controls.  The patient was also noted to have  very large numbers of leg jerks with significant sleep disruption.  The  patient states that he has been told that he has very loud snoring and  pauses in his breathing during sleep.  He typically gets to bed between  1 and 2 a.m. and gets up between 9 and 10 a.m.  He is not rested in the  mornings whenever he arises.  He has been noted to have dozing with  periods of inactivity, including watching television and also reading.  He does have significant sleepiness with driving.  The patient denies  classic symptoms of restless leg syndrome, but his friend states that he  moves his legs all night.  Of note, the patient's weight is up about 80  pounds over the last 8 to 9 months.   PAST MEDICAL HISTORY:  1. Significant for hypertension.  2. Dyslipidemia.  3. History of allergic rhinitis.   CURRENT MEDICATIONS:  1. Include Imodium AD p.r.n.  2. Naproxen p.r.n.  3. Trazodone 100 mg nightly.  4. Klonopin 1 mg t.i.d. p.r.n.   The patient is intolerant/allergic to LISINOPRIL, VANCOMYCIN, and  RIFAMPIN.   SOCIAL HISTORY:  He has a history of smoking  up to 4 packs per day for  32 years.  He has not smoked in 2 years.  The patient is widowed and has  children.   FAMILY HISTORY:  Remarkable for his mother having had asthma, as well as  kidney cancer.   REVIEW OF SYSTEMS:  As per the history of present illness.  Also see  patient intake form documented on the chart.   PHYSICAL EXAM:  GENERAL:  He is an obese male in no acute distress.  Blood pressure 140/84, pulse 80, temperature 98, weight 266 pounds, O2  saturation on room air is 94%.  HEENT:  Pupils are equal, round, and reactive to light and  accommodation.  Extraocular muscles are intact.  Nares show deviated  septum to the left.  Oropharynx shows significant increasing uvula and  palate with side-wall narrowing.  NECK:  Supple without JVD or lymphadenopathy, but it is quite large.  CHEST:  Totally clear.  CARDIAC:  Regular  rate and rhythm with no murmurs, rubs, or gallops.  ABDOMEN:  Soft and nontender with good bowel sounds.  GENITAL, RECTAL, AND BREASTS:  Not done and not indicated.  LOWER EXTREMITIES:  Shows 1+ edema.  Pulses are intact distally.  NEUROLOGIC:  He is alert and oriented, moves all 4 extremities.   IMPRESSION:  1. Very severe obstructive sleep apnea.  I have had a long      conversation with him about the quality of life issues, as well as      the long term cardiovascular health.  Really, given his severity of      his sleep apnea, weight loss and continuous positive airway      pressure are his best 2 options.  The patient is agreeable to      trying this.  2. Questionable restless leg syndrome/periodic leg movement syndrome.      It is unclear whether this is related to chronic musculoskeletal      pain, since he has had both of his hips replaced last year, or      whether this is a primary movement disorder of sleep.  We will go      ahead and treat his sleep apnea first and see how he responds.   PLAN:  1. Initiate CPAP starting at 10 cm.  2. Work  on weight loss.  3. The patient will follow up in 4 weeks or sooner if there are      problems.     Barbaraann Share, MD,FCCP  Electronically Signed    [facility]/MedQ  DD: 05/30/2006  DT: 05/30/2006  Job #: [id number]   cc:   Kingsley Callander. Ouida Sills, MD

## 2010-06-17 NOTE — Assessment & Plan Note (Signed)
[facility]                             PULMONARY OFFICE NOTE   ZYION, DOXTATER                      MRN:          [id number]  DATE:05/28/2006                            DOB:          1950/02/23    SUBJECTIVE:  Mr. Lucas Clark comes in today to follow up on his sleep apnea.  The last visit, he was placed on CPAP at 10 cm for very severe  obstructive sleep apnea.  The patient states he has adapted quite well  to both the mask and the pressure, feels that he is doing well with it.  He is clearly sleeping better at night, and his daytime alertness has  improved, though it continues to be an issue.  After our discussion at  the last visit, he does note that his legs do have abnormal sensation  and that he does move them constantly in the evening, both before he  goes to bed and also while he is sleeping, according to his bed partner.  Also, explained to the patient his medications may be playing a role,  since he is now on oxycodone p.r.n. and has remained on Klonopin.   PHYSICAL EXAMINATION:  GENERAL:  He is an obese white male in no acute  distress.  VITAL SIGNS:  Blood pressure 120/74, pulse 92, temperature is 99, weight  is 258 pounds, O2 saturation on room air is 95%.  There is no evidence  of skin breakdown or pressure necrosis from the CPAP mask.   IMPRESSION:  Very severe obstructive sleep apnea.  The patient has done  very well on CPAP and feels that it is a good therapy for him.  It is  unclear to me whether his persistent sleepiness during the day is from  under-treated sleep apnea, a primary movement disorder of sleep, or  possibly just his sedating medications that he takes.  Although  titration studies show that 10 cm of pressure was adequate for him, I  have looked at this skeptically, given the severity of his sleep apnea.  This degree of sleep apnea usually requires pressures well above this  level.   PLAN:  1. Will go ahead and  start the patient on Requip in an escalating      fashion to 1 mg q.h.s., since he clearly has the symptoms      consistent with restless leg syndrome, as well as severe leg jerks      on his sleep study.  2. Will obtain auto-titrate device for him for two weeks and get a      download to verify whether the 10 cm of pressure is truly adequate      for him.  3. I have asked him to try to minimize his sedating medications during      the day as much as possible.  4. The patient will follow up in approximately 6 months or sooner, if      he feels like he is not improving on the above regimen.  I will      contact him after his auto titrate study  and let him know his      optimal pressure and also to check on how things are going.     Barbaraann Share, MD,FCCP  Electronically Signed   [facility]MC/MedQ  DD: 05/30/2006  DT: 05/30/2006  Job #: [id number]   cc:   Kingsley Callander. Ouida Sills, MD

## 2010-06-17 NOTE — Op Note (Signed)
NAME:  Lucas Clark, Lucas Clark NO.:  [id number]   MEDICAL RECORD NO.:  [id number]          PATIENT TYPE:  INP   LOCATION:  5020                         FACILITY:  [facility]   PHYSICIAN:  Loreta Ave, M.D. DATE OF BIRTH:  December 02, 1950   DATE OF PROCEDURE:  12/14/2005  DATE OF DISCHARGE:                                 OPERATIVE REPORT   PREOPERATIVE DIAGNOSIS:  End-stage degenerative arthritis, left hip,  secondary to avascular necrosis with collapse.   POSTOPERATIVE DIAGNOSIS:  End-stage degenerative arthritis, left hip,  secondary to avascular necrosis with collapse.   PROCEDURE:  Left total hip replacement with Stryker prosthesis (52-mm  acetabular component, screw fixation times two; 32-mm internal diameter  ceramic liner; a Press-Fit Accolade femoral component, size #4; 132-degree  neck angle, 35-mm neck; a 32-mm ceramic head +4-mm).   SURGEON:  Loreta Ave, M.D.   ASSISTANT:  Zonia Kief, P.A., present throughout the entire case.   ANESTHESIA:  General.   ESTIMATED BLOOD LOSS:  150 mL.  Blood given none.   SPECIMENS:  None.   CULTURES:  None.   COMPLICATIONS:  None.   DRESSING:  Soft compressive with abduction pillow.   PROCEDURE:  The patient was brought to the operating room and placed on the  operating table in the supine position.  After adequate anesthesia had been  obtained, turned to the lateral up position, appropriate padding and  support.  Prepped and draped in the usual sterile fashion.  Incision along  the site of the femur extending posterior/superior.  Skin and subcutaneous  tissue divided.  Iliotibial band exposed and incised.  Charnley retractor  put in place.  Neurovascular structures identified and protected.  External  rotators and capsule taken down off the back of the intertrochanteric groove  and the femur, and tagged with FiberWire.  Hip dislocated posteriorly.  Grade 4 AVN collapse, delamination.  Femoral deck was cut a  little more than  a fingerbreadth above the lesser trochanter because of the length of its  neck.  Head removed.  The acetabulum exposed.  Degenerative changes there as  well.  Remnants of labrum, soft tissue, all debrided to get a nice  visualization of the entire rim of the acetabulum.  Sequential reaming up to  good bleeding bone.  Sized for a 52-mm component which was hammered into  placed at 45 degrees of abduction and 20 degrees of anteversion.  Good  capture and fixation.  Augmented with two screws placed through the cup  predrilled and countersunk with a 20-mm and a 16-mm screw.  The ceramic  liner was aligned and then hammered into place with good capture and  fixation.  Attention turned to the femur.  Sequential reaming with the  Accolade reamers up to a #4 component.  After appropriate trials, I used the  132-degree neck angle, 35-mm neck and a +4 x 32-mm ball.  Once trials were  complete, definitive component assembled, hammered down into place in the  femur.  Restored normal femoral anteversion.  Head was attached.  Hip  reduced.  Good restoration of leg length.  Excellent stability in flexion  and extension.  Wound irrigated.  External rotator and capsule repaired to  the back of the intertrochanteric groove through drill holes.  Sutures tied  over bony bridge.  The Charnley retractor was removed.  The iliotibial band  was closed with #1 Vicryl, skin and subcutaneous tissue with Vicryl and  staples.  Margins of the wound injected with Marcaine.  Sterile compressive  dressing applied.  Anesthesia reversed.  Brought to recovery room.  Tolerated surgery well with no complications.      Loreta Ave, M.D.  Electronically Signed     DFM/MEDQ  D:  12/14/2005  T:  12/15/2005  Job:  [id number]

## 2010-06-17 NOTE — Discharge Summary (Signed)
NAME:  Lucas Clark, Lucas Clark NO.:  [id number]   MEDICAL RECORD NO.:  [id number]          PATIENT TYPE:  INP   LOCATION:  5030                         FACILITY:  [facility]   PHYSICIAN:  Robert A. Thurston Hole, M.D. DATE OF BIRTH:  Oct 17, 1950   DATE OF ADMISSION:  08/25/2005  DATE OF DISCHARGE:  08/31/2005                               DISCHARGE SUMMARY   ADMITTING DIAGNOSES:  Infected right total hip, chronic diarrhea,  hypertension, hearing deficit, depression, anxiety, and chronic back  pain.   DISCHARGE DIAGNOSES:  Methicillin-resistant Staphylococcus aureus  infected right total hip, chronic diarrhea, hypertension, hearing  deficit, depression, anxiety, chronic back pain, bacteremia,  hypokalemia, and skin rash.   HISTORY OF PRESENT ILLNESS:  The patient is a 60 year old white male who  had a right total hip replacement by Dr. Mckinley Jewel on 08/10/2005.  He has had serous drainage since surgery, however, recently has begun  spiking a fever.  Today, temperature maximum was 101.3.  He presented to  our office where cultures were done and he was admitted for surgical  I&D.  Risks, benefits, and possible complications of surgical I&D were  discussed with him and his family.  They understand and are without  question.   PROCEDURES IN HOUSE:  On 08/25/2005, the patient underwent an I&D of his  right hip with a poly  exchange.  On 08/26/2005, he received FFP because  his blood was too thin to operated on.  On 08/28/2005, he received a  transfusion, and on 08/27/2005, he had a PICC line put in for  intravenous antibiotics to treat his MRSA.   The patient was placed in the hospital for intravenous antibiotics and  was given FFP because initially on admission his INR was 3.3.  On  08/26/2005, the patient underwent I&D and poly  exchange of his hip.  Cultures were repeated at that time and sent for evaluation.  Infectious  disease was consulted to assist in treatment of his MRSA  and also to  evaluate his pruritic rash.  Postoperative day #2 hemoglobin was 7.6.  He was transfused 2 units of packed red blood cells with 20 of Lasix  between units.  His potassium was 2.9.  He was given 40 mEq of potassium  by mouth four times a day.  Therapy progressed him with ambulation.  He  was placed on rifampin and vancomycin due to his MRSA.  Vancomycin  trough levels were drawn to adjust his dosage appropriately.  On  postoperative day #3, his itching had improved but still had a great  amount of graphic skin with easily reproducible rash on arms and legs.  His potassium was down to 2.5.  His hemoglobin was improved to 9.3.  He  was given three runs of intravenous potassium at 40 mEq by mouth three  times a day.  He is 50% weightbearing.  His Hygroton was discontinued.  Fever was improving, however, on 08/30/2005, he began having recurrent  diarrhea.  Stool cultures were done as well as a C. difficile.  Barrier  cream was ordered as well as Nitrostat cream.  GI was consulted.  His  potassium had improved to 3.1.  His hemoglobin was stable at 9.4.  The  surgical wound was well-approximated with only a small amount of serous  drainage.  He was progressing well with physical therapy.  On  postoperative day four, his potassium was significantly improved at 4.0.  His C. difficile was negative by antibody.  He was alert and oriented  x3.  His biggest complaint was his diarrhea.  He was cleared to be  discharged from the hospital by GI despite his watery diarrhea.  Hemoccult done on the stools was negative and they felt his intravenous  vancomycin and rifampicin would treat his C. difficile negative  pseudomembranous colitis.  He had home health nursing for his  intravenous antibiotics, home health PT and OT for his total joint.  He  will follow up with Dr. Eulah Pont in one week.  He is 50% weightbearing.  He is being discharged to home in stable condition, 50% weightbearing,  with  discharge medications vancomycin 1500 mg intravenously every 12  hours, rifampin 300 mg one tablet twice a day, Fluoro-Q tablets one  tablet twice a day for a month, Restoril 15 mg one tablet at night for  sleep, lisinopril 10 mg one tablet daily, Coumadin 5 mg daily, cosamine  0.375 mg daily, Celexa 20 mg 1/2 tablet a day, hydroxyzine 50 mg one  tablet every 6 hours for itching, Imodium 2 tablets every 6 hours for  diarrhea,  Tylenol 325 mg 2 tablets every 4 hours for pain.  He has been  instructed not to take hydrocodone, oxycodone, methocarbamol,  clonazepam, or Hygroton.  He will follow up with Dr. Eulah Pont on  09/05/2005 at 9 a.m.  [facility] is following him.  He has been  instructed to have his dressing changed daily and not to get his wound  wet.  He has been reminded of his total hip precautions and recommended  he be on a diabetic diet.      Kirstin Shepperson, P.A.      Robert A. Thurston Hole, M.D.  Electronically Signed    KS/MEDQ  D:  12/04/2005  T:  12/04/2005  Job:  [id number]

## 2010-06-17 NOTE — Consult Note (Signed)
NAME:  Lucas Clark, Lucas Clark NO.:  [id number]   MEDICAL RECORD NO.:  [id number]          PATIENT TYPE:  INP   LOCATION:  5015                         FACILITY:  [facility]   PHYSICIAN:  Hillery Aldo, M.D.   DATE OF BIRTH:  1950/03/05   DATE OF CONSULTATION:  DATE OF DISCHARGE:                                   CONSULTATION   REASON FOR CONSULTATION:  Hypokalemia and hyperglycemia.   HISTORY OF PRESENT ILLNESS:  The patient is a 60 year old man with a past  medical history of hypertension and chronic diarrhea who was admitted for a  right total hip replacement.  The patient has done well postoperatively  although he has been hypokalemic and hyperglycemic.  We have been asked to  consult regarding management of these 2 issues.   PAST MEDICAL HISTORY:  1.  Hypertension.  2.  Questionable obstructive sleep apnea.  3.  Osteoarthritis.  4.  Chronic diarrhea status post unremarkable colonoscopy per patient      report.   PAST SURGICAL HISTORY:  1.  Right cataract surgery 1980.  2.  Left carpal tunnel release 2006.  3.  Left ankle repair in 1990.   SOCIAL HISTORY:  The patient is widowed.  He works as a Chartered certified accountant.  He quit  smoking in 1989.  He drinks 1 alcoholic beverage daily.   FAMILY HISTORY:  The patient's father has had an MI and is hypertensive.  Mother died of cancer.   MEDICATIONS:  1.  Hydrochlorothiazide 25 mg daily.  2.  Talopram 10 mg daily.  3.  Hypophamine 0.375 mg daily.  4.  Titralac 400 mg b.i.d.  5.  Imodium AD 4 tablets b.i.d.  6.  Vicodin 1 tablet q. 4 hours p.r.n.   ALLERGIES:  NO KNOWN DRUG ALLERGIES.   REVIEW OF SYSTEMS:  The patient denies any associated nausea or vomiting.  When specifically questioned about his chronic diarrhea, he reports having 1  loose bowel movement on most mornings.  He has not had any blood in the  stool.  Comprehensive review of systems otherwise unremarkable.   PHYSICAL EXAMINATION:  Vital signs: Temperature  99.3, blood pressure 124/77,  pulse 81, respirations 16, O2 saturation 96% on room air.  General: Obese male in no acute distress.  HEENT: Normocephalic, atraumatic.  PERRL.  EOMI.  Oropharynx is clear.  Chest: Lungs clear to auscultation bilaterally with good air movement.  Heart: Regular rate and rhythm.  No murmurs, rubs or gallops.  Abdomen: Soft, nontender, nondistended with normoactive bowel sounds.  Extremities: No clubbing, cyanosis or edema.   DATA REVIEWED:  Laboratory data reveals a sodium of 136, potassium 2.9,  chloride 93, bicarb 33, BUN 12, creatinine 0.8, glucose 157.  White blood  cell count is 11, hemoglobin 10.7, hematocrit 30.9, platelet count 333.  INR  is 2.4.   ASSESSMENT AND PLAN:  1.  Hypokalemia.  The patient's potassium was 2.6 yesterday and he received      100 mEq of replacement.  Despite this, his potassium this morning is      only 2.9.  We would  have expected an increase in his potassium to 3.6      with this amount of supplementation.  Given this, there is a concern      that the patient has a concurrent magnesium deficiency.  We will      therefore check the serum magnesium level and replace if needed.      Otherwise, his persistently low potassium may be related to      hydrochlorothiazide diuretic or his chronic diarrhea.  If his magnesium      is normal, I would discontinue the hydrochlorothiazide and choose an      alternative diuretic such as a potassium sparing diuretic for treatment      of his hypertension.  I would continue to treat his diarrhea      symptomatically with antidiarrheal medications.  2.  Hyperglycemia.  The patient is morbidly obese and it is likely that his      hyperglycemia is reflective of insulin resistance secondary to      physiological stress in the setting of his obesity.  He is at high risk      for becoming diabetic if he is not diabetic already.  Hemoglobin A1c has      been checked by the primary team.  I would  start the patient on      metformin 500 mg b.i.d. and monitor his blood glucoses before meals and      at bedtime.  Additionally he should be set up with outpatient diabetes      education classes and dietary counseling.   DISPOSITION:  I think that it would be reasonable to send the patient home  once his magnesium testing is back.  Again, if his magnesium level is low I  would supplement this with slow mag and once this is repleted, his potassium  should replete with supplementation.  If his magnesium is normal, would  discontinue the hydrochlorothiazide and substitute his potassium sparing  diuretic such as spironolactone or an Ace inhibitor.  Will continue with  potassium supplementation and have some followup with a check of  electrolytes in 1-2 days time.      Hillery Aldo, M.D.  Electronically Signed     CR/MEDQ  D:  08/14/2005  T:  08/15/2005  Job:  [id number]

## 2010-06-17 NOTE — Discharge Summary (Signed)
NAME:  Lucas Clark, Lucas Clark NO.:  [id number]   MEDICAL RECORD NO.:  [id number]          PATIENT TYPE:  INP   LOCATION:  5020                         FACILITY:  [facility]   PHYSICIAN:  Loreta Ave, M.D. DATE OF BIRTH:  07/25/1950   DATE OF ADMISSION:  12/14/2005  DATE OF DISCHARGE:  12/18/2005                               DISCHARGE SUMMARY   FINAL DIAGNOSES:  1. Status post left total hip replacement for end stage degenerative      joint disease.  2. Hypertension.  3. Esophageal reflux disorder.  4. Sleep apnea.  5. Pruritic disorder.   HISTORY OF PRESENT ILLNESS:  An 60 year old white male with a history of  end stage DJD left hip and chronic pain presented to our office for pre-  op evaluation.  He had progressively worsening pain with failed response  to conservative treatment.  Significant decrease in his daily activities  due to the ongoing complaint.   HOSPITAL COURSE:  On 14 December 2005, the patient was taken to the  [facility] OR and a left total hip replacement procedure performed.  Surgeon:  Loreta Ave, M.D.  Assistant:  Genene Churn. Barry Dienes, P.A.C.  Anesthesia:  General.  No surgical specimens.  EBL:  Minimal.  There  were no surgical or anesthesia complications and the patient was  transferred to recovery in a stable condition.  On 15 December 2005, the patient complained of left pain and some  itching.  Given Benadryl.  Vital signs stable, afebrile.  WBC 14.3,  hematocrit 31.6, hemoglobin 10.4, platelets 378.  Sodium 137, potassium  4.5, chloride 100, and CO2 __________  , BUN 12, creatinine 0.7, glucose  141.  Dressing clean, dry and intact.  Calf nontender.  Neurovascularly  intact.  Discontinued morphine PCA due to his itching.  Discontinue  Foley.  On 16 December 2005, the patient doing well.  Temp 99.3, pulse 92,  respirations 20, blood pressure 140/82.  Hemoglobin 9.8, hematocrit 30.  Sodium 133, potassium 3.6, chloride 99, CO2 26, BUN 7,  creatinine 0.6,  glucose 159.  Wound looked good.  Staples intact.  No drainage or signs  of infection.  On 17 December 2005, the patient doing well.  Temp 99.8, pulse 98,  respirations 20, blood pressure 130/76.  Hemoglobin 9.7.  Electrolytes  stable.  Wound looked good.  Staples intact.  No drainage or signs of  infection.  Calf nontender.  Neurovascularly intact.  On 18 December 2005, the patient complained of some itching left lateral  hip and then buttock but improved.  Good pain control.  No chest pain or  shortness of breath.  No BM yet.  Temp 99.3, pulse 60, respirations 18,  blood pressure 120/70.  Hemoglobin 9.9.  Sodium 138, potassium 4.4,  chloride 101, CO2 29, BUN 12, creatinine 0.7, glucose 141.  Incision  looked good.  Staples intact.  No drainage or signs of infection.  He  did have diffuse welts on left lateral hip and buttock with some  extension to the right buttock.  A few tape blisters.  Calves nontender.  Neurovascularly  intact.  Dulcolax suppository ordered.  The patient  progressed well with PT and stable for discharge.   MEDICATIONS:  1. Percocet 5/325, one or two tabs p.o. every four to six hours p.r.n.      pain.  2. Resume previous home meds.   CONDITION:  Good, stable.   DISPOSITION:  Discharged home.   INSTRUCTIONS:  The patient will work with home health PT OT to improve  ambulation and strengthening.  He will be touchdown weightbearing.  Dry  dressing changes daily.  Follow up in the office when he is two weeks  post-op for recheck and possible staple removal.  Return sooner if  needed.      Genene Churn. Denton Meek.      Loreta Ave, M.D.  Electronically Signed    JMO/MEDQ  D:  02/07/2006  T:  02/07/2006  Job:  [id number]

## 2010-06-17 NOTE — Discharge Summary (Signed)
NAME:  Lucas Clark, Lucas Clark NO.:  [id number]   MEDICAL RECORD NO.:  [id number]          PATIENT TYPE:  INP   LOCATION:  6715                         FACILITY:  [facility]   PHYSICIAN:  Marinda Elk, M.D.DATE OF BIRTH:  09-01-1950   DATE OF ADMISSION:  09/14/2005  DATE OF DISCHARGE:  09/25/2005                                 DISCHARGE SUMMARY   DATE OF ADMISSION:  September 14, 2005   DATE OF DISCHARGE:  September 25, 2005   ATTENDING PHYSICIAN:  Duncan Dull, M.D.   CONSULTANTS:  Dr. Lenn Sink  Dr. Mayford Knife.   DISCHARGE DIAGNOSES:  1. Allergic reaction secondary to ACE versus vancomycin and rifampin..  2. Acute renal failure secondarily to hypotension.  3. Respiratory failure, ventilatory dependent.  4. Hypokalemia.  5. Pulmonary edema.  6. Hypertension.   DISCHARGE MEDICATIONS:  1. Flovent Diskus b.i.d. 3 times a week.  2. Benadryl 25 mg daily q.6 for 30 days.  3. Citalopram 20 mg daily.  4. Topical hydrocortisone cream 0.1% b.i.d. for 30 days.  5. Protonix 40 mg daily for 25 days.  6. Prednisone 40 mg daily with slow taper decrease of 5 mg every 5 days.  7. Peridex 0.12% solution mouthwash daily.   PROCEDURES PERFORMED:  A 2D echo showed normal left ventricular systolic  function of 60%.  Biopsy of the skin showed urticaria allergic reaction.  Renal ultrasound, nonspecific renal medical disease, no hydronephrosis.  Urine cytology showed no eosinophils.  Chest x-ray showed pulmonary edema.   DISPOSITION:  The patient is to follow up with Dr. Orvan Falconer at the  outpatient clinic on October 04, 2005, at 3:30 p.m.  The patient is  currently off antibiotics and will be evaluated to see if antibiotics are  need, to be resumed.   The patient is to follow up with primary care physician, Dr. Ouida Sills, on  October 23, 2005, and evaluated on his hypertension and to resume blood  pressure if needed and modify as needed.  Daily prednisone will be slow as  per  dermatology as 5mg  decrease every 5 day,   Followup with Dr. Eulah Pont, orthopedics, on October 03, 2005, at 10:30 a.m.  to be evaluated for his total hip replacement.   CONSULTANTS:  Dr. Roxan Hockey, ID; Dr. Mayford Knife, dermatology.   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  On the 27th, he was taken to surgery for a second time for  debridement and was started on vancomycin and rifampin.  At that time,  cultures grew MRSA.  The patient followed up with Dr. Orvan Falconer outpatient  clinic, ID, on September 12, 2005, where vancomycin and rifampin were stopped,  and he was started on minocycline for the infection, thinking that he was  having an allergic reaction to vancomycin and rifampin.  On September 13, 2005,  the patient started developing blisters.  They later developed into bullae  on mucosa and skin  and was brought to the ED.  At that time, the patient  developed subglottic angioedema and was intubated and transferred to the  ICU.  The patient developed acute renal failure secondary to hypotension and  bullae got infected, which were positive for MRSA.  During ICU, high dose  steroids were given and pressors were given.  A biopsy of the skin showed  urticaria reaction.  Antibiotics were held.  He was extubated on September 20, 2005, and developed shortness of breath secondary to pulmonary edema, which  responded to Lasix, which has resolved.  Since transfer to floor, the  patient is on IV steroids and topical.  The patient has been transferred to  floor on September 21, 2005, and is asymptomatic.  Therapy was discontinued.  On physical exam he is totally asymptomatic.  All bullae have resolved and  dried.  The patient is afebrile with no use of accessory muscles or other  symptoms of allergic  reaction, or respiratory failure.  The patient is  currently passing urine.  Vital signs are within normal limits and the  patient is currently off antibiotics.   LABS:  White blood cells 6.1, hemoglobin 10.2, hematocrit 30.7, platelets  320, sodium 144, potassium 3.4, chloride 102, bicarbonate 33, BUN 21,  creatinine 1.5, glucose 100, potassium 8.6, BNP 545.   HOSPITAL COURSE:  1. Status post allergic reaction secondary to ACE versus rifampin and      vancomycin.  The patient is currently on topical steroids and IV      antibiotic, which he responded to in the ICU and was change prednisone      PO to be sent home with a slow taper.  The patient is on Benadryl and      Protonix and Flovent Diskus.  Bullae have resolved, which do not seem      to be infected.  The patient has itching.  Please refer to H&P for      further details.  Temperature is 98.8, white blood cells 6.1 secondary      to steroids, blood pressure 119/60, respirations rate 20.  The      patient's vital signs and temperature remained stable during his      transfer from ICU until discharge.  2. Status post VDRF secondary to subglottic edema.  The patient is status      post extubation day 2.  Respirations 20.  Please refer to H&P for      further details. 96% stauration on room air.  3. Acute renal failure secondary to hypotension, which has resolved and      which responded to fluid.  The patient's creatinine since transfer to      floor, is 1.5 from previous 3.5 on September 14, 2005.  BUN is 21 and the      patient is passing urine normally. with a BUN of 21 and creatinine of      1.5.  4. Pulmonary edema, which was treated with Lasix and has resolved.  Chest      x-ray was done that showed pulmonary edema.  Patient is asx with      respiration 20.  5. Hypokalemia secondary to diuretics.  It was treated with potassium IV      and p.o. which have resolved.  6. Hypertension.  Blood pressure medications were held since  admission.     The patient's blood pressure has returned to normal limits and has been      stable  since discharge from the ICU to floor.  The patient will be      followed up by his primary care physician for blood pressure medication      to be resumed and readjusted as needed.   LABS:  White blood cells 16.1, hemoglobin 10.2, hematocrit 30.7, platelets  327, sodium 144, potassium 3.4, chloride 107, bicarbonate 33, BUN 21,  creatinine 1.5, glucose 100, calcium 8.6, ESR 35.  Vital signs 98.8, pulse  74, respiration 19, blood pressure 130/71.  The patient is saturating at 96%  on room air.      Marinda Elk, M.D.  Electronically Signed     AF/MEDQ  D:  09/30/2005  T:  09/30/2005  Job:  [id number]   cc:   Cliffton Asters, M.D.  Loreta Ave, M.D.  Rockey Situ. Flavia Shipper., M.D.

## 2010-06-17 NOTE — Procedures (Signed)
NAME:  Lucas Clark, Lucas Clark NO.:  [id number]   MEDICAL RECORD NO.:  [id number]          PATIENT TYPE:  OUT   LOCATION:  SLEEP LAB                     FACILITY:  [facility]   PHYSICIAN:  Barbaraann Share, MD,FCCPDATE OF BIRTH:  08-Apr-1950   DATE OF STUDY:  02/11/2006                            NOCTURNAL POLYSOMNOGRAM   REFERRING PHYSICIAN:  Kingsley Callander. Fagan MD   INDICATION FOR STUDY:  Hypersomnia with sleep apnea.   EPWORTH SLEEPINESS SCORE:  8.   SLEEP ARCHITECTURE:  The patient had a total sleep time of 251 minutes  with decreased REM and never achieved slow wave sleep.  Sleep onset  latency was prolonged at 66 minutes, and REM onset was prolonged as well  at 199 minutes.  Sleep efficiency was very poor at 65%.   RESPIRATORY DATA:  The patient underwent split night protocol where he  was found to have 199 obstructive events in the first 88 minutes of  sleep.  This gave him a respiratory disturbance index over that time  period of 136 events per hour.  The events occurred in all body  positions and there was loud snoring noted throughout.  By protocol, the  patient was then placed on a standard ResMed nasal CPAP mask and  titrated ultimately to a final pressure of 10-cm with fairly good  control of the patient's obstructive events even through REM.   OXYGEN DATA:  There was O2 desaturation as low as 84% with the  obstructive events.   CARDIAC DATA:  No clinically significant cardiac arrhythmias were noted.   MOVEMENT-PARASOMNIA:  The patient was found to have 270 leg jerks with  two per hour resulting in arousal or awakening.   IMPRESSIONS-RECOMMENDATIONS:  1. Very severe obstructive sleep apnea/hypopnea syndrome with a      respiratory disturbance index over the first half of the night of      136 events per hour and oxygen desaturation as low as 84%.  The      patient was then placed on CPAP with a standard ResMed nasal CPAP      mask and titrated to a final pressure  of 10-cm with excellent      control.  2. Large numbers of leg jerks with significant sleep disruption even      on optimal CPAP.  Clinical correlation is suggested to see if the      patient may have a primary movement disorder of sleep in addition      to his obstructive      sleep apnea.  One approach may be to treat his sleep apnea      optimally and see if he continues to have further complaints or      symptoms.      Barbaraann Share, MD,FCCP  Diplomate, American Board of Sleep  Medicine  Electronically Signed     [facility]/MEDQ  D:  02/22/2006 16:19:55  T:  02/22/2006 21:38:06  Job:  [id number]
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
## 2010-06-17 NOTE — Op Note (Signed)
NAME:  Lucas Clark, Lucas Clark NO.:  [id number]   MEDICAL RECORD NO.:  [id number]          PATIENT TYPE:  INP   LOCATION:  5030                         FACILITY:  [facility]   PHYSICIAN:  Elana Alm. Thurston Hole, M.D. DATE OF BIRTH:  1951/01/16   DATE OF PROCEDURE:  08/25/2005  DATE OF DISCHARGE:                                 OPERATIVE REPORT   PREOPERATIVE DIAGNOSIS:  Right hip infection status post total hip  replacement.   POSTOPERATIVE DIAGNOSIS:  Right hip infection status post total hip  replacement.   PROCEDURE:  Right hip irrigation and debridement.   SURGEON:  Elana Alm. Thurston Hole, M.D.   ANESTHESIA:  General.   OPERATIVE TIME:  58 minutes.   COMPLICATIONS:  None.   DESCRIPTION OF PROCEDURE:  Mr. Cherne was brought to the operating room on  August 25, 2005.  He was placed on the operating table in the supine position.  Preoperative cultures had shown gram positive cocci in the superficial  space, white blood cell count of 24,000, signs and symptoms consistent with  a hip infection.  Is not known, yet, until during the procedure, whether  this is a superficial or deep infection.  After being placed under general  anesthesia, he had a Foley catheter placed under sterile conditions.  He had  already received vancomycin preoperatively.  His right hip was examined.  He  had purulent drainage from the previous total hip incision.  The staples  were removed.  His hip showed flexion to 90, extension to 0, internal and  external rotation of 30 degrees, and was stable.  He was then turned into  the right lateral decubitus position and secured on the bed with Mark frame.  His right hip and leg was prepped using sterile Betadine and draped using  sterile technique.  The previous incision was opened.  There was found to be  a large amount of injected seroma with purulent material, this was  thoroughly irrigated and debrided.  The underlying fascia, however, was  intact and sealed.   6 liters of jet lavage saline were jet lavage irrigated  through the entire subcutaneous and deep subcutaneous space to the level of  fascia and very thorough debridement was carried out of all granulation type  infected tissue in the subacromial space.  After this was done, then through  posterior musculature that was totally clean, I placed in a 18 gauge spinal  needle on a syringe and attempted to aspirate any fluid from the joint to  make sure there was no fluid or infection in the deep joint space.  I  obtained no fluid and nothing to indicate that there was a deep infection.  Because of this, I did not feel it was necessary to open the fascia and  potentially contaminate the deep joint space.  After this aspiration attempt  was carried out and further debridement was carried out, it was felt that  there was now very clean and viable and healthy appearing tissue in the  subcutaneous space all the way down to the level of the fascia and the  fascial sutures which had been previously placed during his total hip  replacement remained in place.  At this point, then, the wound was  closed with #1 Ethilon retention sutures over a medium Hemovac drain.  Sterile dressings were applied.  The patient turned supine, awakened, and  transferred to his hospital bed and taken to the recovery room in stable  condition.  Needle and sponge counts were correct x2 at the in the case.      Robert A. Thurston Hole, M.D.  Electronically Signed     RAW/MEDQ  D:  08/26/2005  T:  08/26/2005  Job:  [id number]

## 2010-08-08 ENCOUNTER — Other Ambulatory Visit: Payer: Self-pay | Admitting: Neurosurgery

## 2010-08-08 ENCOUNTER — Ambulatory Visit
Admission: RE | Admit: 2010-08-08 | Discharge: 2010-08-08 | Disposition: A | Discharge status: Home / Self Care | Payer: Medicare Other | Source: Ambulatory Visit | Attending: Neurosurgery | Admitting: Neurosurgery

## 2010-08-08 DIAGNOSIS — M542 Cervicalgia: Secondary | ICD-10-CM

## 2010-08-08 DIAGNOSIS — S129XXA Fracture of neck, unspecified, initial encounter: Secondary | ICD-10-CM

## 2010-10-04 ENCOUNTER — Ambulatory Visit
Admission: RE | Admit: 2010-10-04 | Discharge: 2010-10-04 | Disposition: A | Discharge status: Home / Self Care | Payer: Medicare Other | Source: Ambulatory Visit | Attending: Neurosurgery | Admitting: Neurosurgery

## 2010-10-04 ENCOUNTER — Other Ambulatory Visit: Payer: Self-pay | Admitting: Neurosurgery

## 2010-10-04 DIAGNOSIS — M25511 Pain in right shoulder: Secondary | ICD-10-CM

## 2010-10-04 DIAGNOSIS — M542 Cervicalgia: Secondary | ICD-10-CM

## 2010-10-08 ENCOUNTER — Other Ambulatory Visit: Payer: Medicare Other

## 2010-10-15 ENCOUNTER — Ambulatory Visit
Admission: RE | Admit: 2010-10-15 | Discharge: 2010-10-15 | Disposition: A | Discharge status: Home / Self Care | Payer: Medicare Other | Source: Ambulatory Visit | Attending: Neurosurgery | Admitting: Neurosurgery

## 2010-10-15 DIAGNOSIS — M25511 Pain in right shoulder: Secondary | ICD-10-CM

## 2010-10-24 DIAGNOSIS — X58XXXA Exposure to other specified factors, initial encounter: Secondary | ICD-10-CM

## 2010-10-24 DIAGNOSIS — S060X9A Concussion with loss of consciousness of unspecified duration, initial encounter: Secondary | ICD-10-CM

## 2010-10-24 DIAGNOSIS — F4323 Adjustment disorder with mixed anxiety and depressed mood: Secondary | ICD-10-CM

## 2011-05-11 ENCOUNTER — Encounter (INDEPENDENT_AMBULATORY_CARE_PROVIDER_SITE_OTHER): Payer: Medicare Other | Admitting: Ophthalmology

## 2011-05-11 DIAGNOSIS — H2510 Age-related nuclear cataract, unspecified eye: Secondary | ICD-10-CM

## 2011-05-11 DIAGNOSIS — H33309 Unspecified retinal break, unspecified eye: Secondary | ICD-10-CM

## 2011-05-11 DIAGNOSIS — H4310 Vitreous hemorrhage, unspecified eye: Secondary | ICD-10-CM

## 2011-05-11 DIAGNOSIS — E11319 Type 2 diabetes mellitus with unspecified diabetic retinopathy without macular edema: Secondary | ICD-10-CM

## 2011-05-11 DIAGNOSIS — H43819 Vitreous degeneration, unspecified eye: Secondary | ICD-10-CM

## 2011-05-11 DIAGNOSIS — E1139 Type 2 diabetes mellitus with other diabetic ophthalmic complication: Secondary | ICD-10-CM

## 2011-05-12 ENCOUNTER — Encounter (HOSPITAL_COMMUNITY): Payer: Self-pay | Admitting: Pharmacy Technician

## 2011-05-12 ENCOUNTER — Other Ambulatory Visit (INDEPENDENT_AMBULATORY_CARE_PROVIDER_SITE_OTHER): Payer: Self-pay | Admitting: Ophthalmology

## 2011-05-12 NOTE — H&P (Signed)
Lucas Clark is an 61 y.o. male.   Chief Complaint:Loss of vision OD HPI: Diabetic with loss of vision OD due to vitreous hemorrhage  Past Medical History  Diagnosis Date  . Diabetes mellitus   . Hypertension     No past surgical history on file.  No family history on file. Social History:  does not have a smoking history on file. He does not have any smokeless tobacco history on file. His alcohol and drug histories not on file.  Allergies:  Allergies  Allergen Reactions  . Lisinopril   . Rifampin   . Vancomycin     No current facility-administered medications on file as of .   No current outpatient prescriptions on file as of .    Review of systems otherwise negative  There were no vitals taken for this visit.  Physical exam: Mental status: oriented x3. Eyes: See eye exam associated with this date of surgery in media tab.  Scanned in by scanning center Ears, Nose, Throat: within normal limits Neck: Within Normal limits General: within normal limits Chest: Within normal limits Breast: deferred Heart: Within normal limits Abdomen: Within normal limits GU: deferred Extremities: within normal limits Skin: within normal limits  Assessment/Plan Vitreous hemorrhage Plan: To [facility] for Pars plana vitrectomy right eye, with laser treatment and gas injection  Sherrie George 05/12/2011, 7:41 AM

## 2011-05-18 ENCOUNTER — Encounter (HOSPITAL_COMMUNITY): Payer: Self-pay | Admitting: *Deleted

## 2011-05-22 MED ORDER — CEFAZOLIN SODIUM-DEXTROSE 2-3 GM-% IV SOLR
2.0000 g | INTRAVENOUS | Status: AC
  Administered 2011-05-23: 2 g via INTRAVENOUS
  Filled 2011-05-22 (×2): qty 50

## 2011-05-23 ENCOUNTER — Encounter (HOSPITAL_COMMUNITY): Payer: Self-pay | Admitting: Certified Registered Nurse Anesthetist

## 2011-05-23 ENCOUNTER — Ambulatory Visit (HOSPITAL_COMMUNITY): Payer: Medicare Other

## 2011-05-23 ENCOUNTER — Other Ambulatory Visit (HOSPITAL_COMMUNITY): Payer: Self-pay | Admitting: Ophthalmology

## 2011-05-23 ENCOUNTER — Ambulatory Visit (HOSPITAL_COMMUNITY)
Admission: RE | Admit: 2011-05-23 | Discharge: 2011-05-24 | Disposition: A | Discharge status: Home / Self Care | Payer: Medicare Other | Source: Ambulatory Visit | Attending: Ophthalmology | Admitting: Ophthalmology

## 2011-05-23 ENCOUNTER — Ambulatory Visit (HOSPITAL_COMMUNITY): Payer: Medicare Other | Admitting: Certified Registered Nurse Anesthetist

## 2011-05-23 ENCOUNTER — Encounter (HOSPITAL_COMMUNITY)
Admission: RE | Disposition: A | Discharge status: Home / Self Care | Payer: Self-pay | Source: Ambulatory Visit | Attending: Ophthalmology

## 2011-05-23 ENCOUNTER — Other Ambulatory Visit (INDEPENDENT_AMBULATORY_CARE_PROVIDER_SITE_OTHER): Payer: Self-pay | Admitting: Ophthalmology

## 2011-05-23 ENCOUNTER — Encounter (HOSPITAL_COMMUNITY): Payer: Self-pay | Admitting: *Deleted

## 2011-05-23 DIAGNOSIS — I10 Essential (primary) hypertension: Secondary | ICD-10-CM | POA: Insufficient documentation

## 2011-05-23 DIAGNOSIS — H4310 Vitreous hemorrhage, unspecified eye: Secondary | ICD-10-CM | POA: Insufficient documentation

## 2011-05-23 DIAGNOSIS — H4311 Vitreous hemorrhage, right eye: Secondary | ICD-10-CM | POA: Diagnosis present

## 2011-05-23 DIAGNOSIS — E11319 Type 2 diabetes mellitus with unspecified diabetic retinopathy without macular edema: Secondary | ICD-10-CM

## 2011-05-23 DIAGNOSIS — E1139 Type 2 diabetes mellitus with other diabetic ophthalmic complication: Secondary | ICD-10-CM

## 2011-05-23 DIAGNOSIS — E11359 Type 2 diabetes mellitus with proliferative diabetic retinopathy without macular edema: Secondary | ICD-10-CM | POA: Insufficient documentation

## 2011-05-23 DIAGNOSIS — G4730 Sleep apnea, unspecified: Secondary | ICD-10-CM | POA: Insufficient documentation

## 2011-05-23 DIAGNOSIS — E1165 Type 2 diabetes mellitus with hyperglycemia: Secondary | ICD-10-CM

## 2011-05-23 DIAGNOSIS — K08409 Partial loss of teeth, unspecified cause, unspecified class: Secondary | ICD-10-CM | POA: Insufficient documentation

## 2011-05-23 HISTORY — DX: Sleep apnea, unspecified: G47.30

## 2011-05-23 HISTORY — DX: Restless legs syndrome: G25.81

## 2011-05-23 HISTORY — DX: Pure hypercholesterolemia, unspecified: E78.00

## 2011-05-23 HISTORY — DX: Bronchitis, not specified as acute or chronic: J40

## 2011-05-23 HISTORY — DX: Encounter for other specified aftercare: Z51.89

## 2011-05-23 HISTORY — DX: Cerebral infarction, unspecified: I63.9

## 2011-05-23 HISTORY — DX: Reserved for inherently not codable concepts without codable children: IMO0001

## 2011-05-23 HISTORY — PX: PARS PLANA VITRECTOMY: SHX2166

## 2011-05-23 HISTORY — DX: Type 2 diabetes mellitus without complications: E11.9

## 2011-05-23 HISTORY — PX: GAS/FLUID EXCHANGE: SHX5334

## 2011-05-23 LAB — CBC
MCH: 30.3 pg (ref 26.0–34.0)
MCV: 89.4 fL (ref 78.0–100.0)
Platelets: 210 10*3/uL (ref 150–400)
RDW: 13.5 % (ref 11.5–15.5)
WBC: 13.5 10*3/uL — ABNORMAL HIGH (ref 4.0–10.5)

## 2011-05-23 LAB — GLUCOSE, CAPILLARY
Glucose-Capillary: 120 mg/dL — ABNORMAL HIGH (ref 70–99)
Glucose-Capillary: 153 mg/dL — ABNORMAL HIGH (ref 70–99)
Glucose-Capillary: 194 mg/dL — ABNORMAL HIGH (ref 70–99)
Glucose-Capillary: 98 mg/dL (ref 70–99)

## 2011-05-23 LAB — BASIC METABOLIC PANEL
CO2: 25 mEq/L (ref 19–32)
Calcium: 9.5 mg/dL (ref 8.4–10.5)
Creatinine, Ser: 0.85 mg/dL (ref 0.50–1.35)
Glucose, Bld: 151 mg/dL — ABNORMAL HIGH (ref 70–99)

## 2011-05-23 SURGERY — PARS PLANA VITRECTOMY WITH 25 GAUGE
Anesthesia: General | Site: Eye | Laterality: Right | Wound class: Clean

## 2011-05-23 MED ORDER — EPHEDRINE SULFATE 50 MG/ML IJ SOLN
INTRAMUSCULAR | Status: DC | PRN
  Administered 2011-05-23: 10 mg via INTRAVENOUS

## 2011-05-23 MED ORDER — STERILE WATER FOR IRRIGATION IR SOLN
Status: DC | PRN
  Administered 2011-05-23: 1000 mL

## 2011-05-23 MED ORDER — METFORMIN HCL 500 MG PO TABS
500.0000 mg | ORAL_TABLET | Freq: Two times a day (BID) | ORAL | Status: DC
  Administered 2011-05-23 – 2011-05-24 (×2): 500 mg via ORAL
  Filled 2011-05-23 (×4): qty 1

## 2011-05-23 MED ORDER — EPINEPHRINE HCL 1 MG/ML IJ SOLN
INTRAOCULAR | Status: DC | PRN
  Administered 2011-05-23: 13:00:00

## 2011-05-23 MED ORDER — TROPICAMIDE 1 % OP SOLN
1.0000 [drp] | OPHTHALMIC | Status: AC | PRN
  Administered 2011-05-23 (×3): 1 [drp] via OPHTHALMIC
  Filled 2011-05-23: qty 3

## 2011-05-23 MED ORDER — MORPHINE SULFATE 2 MG/ML IJ SOLN: 1.0000 mg | INTRAMUSCULAR | Status: DC | PRN

## 2011-05-23 MED ORDER — GLYCOPYRROLATE 0.2 MG/ML IJ SOLN
INTRAMUSCULAR | Status: DC | PRN
  Administered 2011-05-23: .8 mg via INTRAVENOUS

## 2011-05-23 MED ORDER — BACITRACIN-POLYMYXIN B 500-10000 UNIT/GM OP OINT
1.0000 "application " | TOPICAL_OINTMENT | Freq: Four times a day (QID) | OPHTHALMIC | Status: DC
  Filled 2011-05-23: qty 3.5

## 2011-05-23 MED ORDER — ACETAMINOPHEN 325 MG PO TABS: 325.0000 mg | ORAL_TABLET | ORAL | Status: DC | PRN

## 2011-05-23 MED ORDER — PROVISC 10 MG/ML IO SOLN
INTRAOCULAR | Status: DC | PRN
  Administered 2011-05-23: .85 mL via INTRAOCULAR

## 2011-05-23 MED ORDER — DEXAMETHASONE SODIUM PHOSPHATE 10 MG/ML IJ SOLN
INTRAMUSCULAR | Status: DC | PRN
  Administered 2011-05-23: 10 mg

## 2011-05-23 MED ORDER — LACTATED RINGERS IV SOLN
INTRAVENOUS | Status: DC | PRN
  Administered 2011-05-23 (×2): via INTRAVENOUS

## 2011-05-23 MED ORDER — BSS IO SOLN
INTRAOCULAR | Status: DC | PRN
  Administered 2011-05-23: 15 mL via INTRAOCULAR

## 2011-05-23 MED ORDER — PROPOFOL 10 MG/ML IV EMUL
INTRAVENOUS | Status: DC | PRN
  Administered 2011-05-23: 150 mg via INTRAVENOUS

## 2011-05-23 MED ORDER — SODIUM CHLORIDE 0.9 % IV SOLN
INTRAVENOUS | Status: DC
  Administered 2011-05-23: 11:00:00 via INTRAVENOUS

## 2011-05-23 MED ORDER — PHENYLEPHRINE HCL 2.5 % OP SOLN
1.0000 [drp] | OPHTHALMIC | Status: AC | PRN
  Administered 2011-05-23 (×3): 1 [drp] via OPHTHALMIC
  Filled 2011-05-23: qty 3

## 2011-05-23 MED ORDER — CYCLOPENTOLATE HCL 1 % OP SOLN
1.0000 [drp] | OPHTHALMIC | Status: AC | PRN
  Administered 2011-05-23 (×3): 1 [drp] via OPHTHALMIC
  Filled 2011-05-23: qty 2

## 2011-05-23 MED ORDER — TEMAZEPAM 15 MG PO CAPS: 15.0000 mg | ORAL_CAPSULE | Freq: Every evening | ORAL | Status: DC | PRN

## 2011-05-23 MED ORDER — OXYCODONE-ACETAMINOPHEN 5-325 MG PO TABS
1.0000 | ORAL_TABLET | ORAL | Status: DC | PRN
  Administered 2011-05-23 (×2): 1 via ORAL
  Administered 2011-05-24: 2 via ORAL
  Filled 2011-05-23 (×2): qty 1
  Filled 2011-05-23: qty 2

## 2011-05-23 MED ORDER — PREDNISOLONE ACETATE 1 % OP SUSP
1.0000 [drp] | Freq: Four times a day (QID) | OPHTHALMIC | Status: DC
  Filled 2011-05-23: qty 1
  Filled 2011-05-23 (×2): qty 5

## 2011-05-23 MED ORDER — SODIUM CHLORIDE 0.9 % IJ SOLN
INTRAMUSCULAR | Status: DC | PRN
  Administered 2011-05-23: 13:00:00

## 2011-05-23 MED ORDER — FENTANYL CITRATE 0.05 MG/ML IJ SOLN
INTRAMUSCULAR | Status: DC | PRN
  Administered 2011-05-23: 50 ug via INTRAVENOUS
  Administered 2011-05-23: 100 ug via INTRAVENOUS

## 2011-05-23 MED ORDER — ONDANSETRON HCL 4 MG/2ML IJ SOLN
INTRAMUSCULAR | Status: DC | PRN
  Administered 2011-05-23: 4 mg via INTRAVENOUS

## 2011-05-23 MED ORDER — SUCCINYLCHOLINE CHLORIDE 20 MG/ML IJ SOLN
INTRAMUSCULAR | Status: DC | PRN
  Administered 2011-05-23: 100 mg via INTRAVENOUS

## 2011-05-23 MED ORDER — BRIMONIDINE TARTRATE 0.2 % OP SOLN
1.0000 [drp] | Freq: Two times a day (BID) | OPHTHALMIC | Status: DC
  Filled 2011-05-23: qty 5

## 2011-05-23 MED ORDER — HYDROMORPHONE HCL PF 1 MG/ML IJ SOLN
INTRAMUSCULAR | Status: AC
  Filled 2011-05-23: qty 1

## 2011-05-23 MED ORDER — SODIUM CHLORIDE 0.45 % IV SOLN
INTRAVENOUS | Status: DC
  Administered 2011-05-23: 17:00:00 via INTRAVENOUS

## 2011-05-23 MED ORDER — HYDROMORPHONE HCL PF 1 MG/ML IJ SOLN
0.2500 mg | INTRAMUSCULAR | Status: DC | PRN
  Administered 2011-05-23 (×3): 0.5 mg via INTRAVENOUS

## 2011-05-23 MED ORDER — GATIFLOXACIN 0.5 % OP SOLN
1.0000 [drp] | OPHTHALMIC | Status: AC | PRN
  Administered 2011-05-23 (×3): 1 [drp] via OPHTHALMIC
  Filled 2011-05-23: qty 2.5

## 2011-05-23 MED ORDER — MUPIROCIN 2 % EX OINT
TOPICAL_OINTMENT | Freq: Two times a day (BID) | CUTANEOUS | Status: DC
  Administered 2011-05-23: 1 via NASAL

## 2011-05-23 MED ORDER — HEMOSTATIC AGENTS (NO CHARGE) OPTIME
TOPICAL | Status: DC | PRN
  Administered 2011-05-23: 1

## 2011-05-23 MED ORDER — MUPIROCIN 2 % EX OINT
TOPICAL_OINTMENT | CUTANEOUS | Status: AC
  Filled 2011-05-23: qty 22

## 2011-05-23 MED ORDER — ROCURONIUM BROMIDE 100 MG/10ML IV SOLN
INTRAVENOUS | Status: DC | PRN
  Administered 2011-05-23: 20 mg via INTRAVENOUS

## 2011-05-23 MED ORDER — BUPIVACAINE HCL 0.75 % IJ SOLN
INTRAMUSCULAR | Status: DC | PRN
  Administered 2011-05-23: 10 mL

## 2011-05-23 MED ORDER — MAGNESIUM HYDROXIDE 400 MG/5ML PO SUSP: 15.0000 mL | Freq: Four times a day (QID) | ORAL | Status: DC | PRN

## 2011-05-23 MED ORDER — BACITRACIN-POLYMYXIN B 500-10000 UNIT/GM OP OINT
TOPICAL_OINTMENT | OPHTHALMIC | Status: DC | PRN
  Administered 2011-05-23: 1 via OPHTHALMIC

## 2011-05-23 MED ORDER — MIDAZOLAM HCL 5 MG/5ML IJ SOLN
INTRAMUSCULAR | Status: DC | PRN
  Administered 2011-05-23: 2 mg via INTRAVENOUS

## 2011-05-23 MED ORDER — ATROPINE SULFATE 1 % OP SOLN
OPHTHALMIC | Status: DC | PRN
  Administered 2011-05-23: 1 [drp] via OPHTHALMIC

## 2011-05-23 MED ORDER — TETRACAINE HCL 0.5 % OP SOLN
2.0000 [drp] | Freq: Once | OPHTHALMIC | Status: DC
  Filled 2011-05-23: qty 2

## 2011-05-23 MED ORDER — INSULIN ASPART 100 UNIT/ML ~~LOC~~ SOLN
0.0000 [IU] | SUBCUTANEOUS | Status: DC
  Administered 2011-05-23: 3 [IU] via SUBCUTANEOUS
  Administered 2011-05-24: 2 [IU] via SUBCUTANEOUS

## 2011-05-23 MED ORDER — LATANOPROST 0.005 % OP SOLN
1.0000 [drp] | Freq: Every day | OPHTHALMIC | Status: DC
  Filled 2011-05-23: qty 2.5

## 2011-05-23 MED ORDER — ACETAZOLAMIDE SODIUM 500 MG IJ SOLR
500.0000 mg | Freq: Once | INTRAMUSCULAR | Status: AC
  Administered 2011-05-24: 500 mg via INTRAVENOUS
  Filled 2011-05-23: qty 500

## 2011-05-23 MED ORDER — BSS PLUS IO SOLN
INTRAOCULAR | Status: DC | PRN
  Administered 2011-05-23: 1 via INTRAOCULAR

## 2011-05-23 MED ORDER — ONDANSETRON HCL 4 MG/2ML IJ SOLN: 4.0000 mg | Freq: Four times a day (QID) | INTRAMUSCULAR | Status: DC | PRN

## 2011-05-23 MED ORDER — LIDOCAINE HCL (CARDIAC) 20 MG/ML IV SOLN
INTRAVENOUS | Status: DC | PRN
  Administered 2011-05-23: 50 mg via INTRAVENOUS

## 2011-05-23 MED ORDER — GATIFLOXACIN 0.5 % OP SOLN
1.0000 [drp] | Freq: Four times a day (QID) | OPHTHALMIC | Status: DC
  Filled 2011-05-23: qty 2.5

## 2011-05-23 MED ORDER — NEOSTIGMINE METHYLSULFATE 1 MG/ML IJ SOLN
INTRAMUSCULAR | Status: DC | PRN
  Administered 2011-05-23: 5 mg via INTRAVENOUS

## 2011-05-23 SURGICAL SUPPLY — 68 items
APL SRG 3 HI ABS STRL LF PLS (MISCELLANEOUS) ×1
APPLICATOR DR MATTHEWS STRL (MISCELLANEOUS) ×1 IMPLANT
BALL CTTN LRG ABS STRL LF (GAUZE/BANDAGES/DRESSINGS) ×3
BLADE EYE CATARACT 19 1.4 BEAV (BLADE) IMPLANT
BLADE MVR KNIFE 19G (BLADE) IMPLANT
BLADE MVR KNIFE 20G (BLADE) IMPLANT
CANNULA DUAL BORE 23G (CANNULA) IMPLANT
CANNULA FLEX TIP 25G (CANNULA) IMPLANT
CLOTH BEACON ORANGE TIMEOUT ST (SAFETY) ×2 IMPLANT
CORDS BIPOLAR (ELECTRODE) IMPLANT
COTTONBALL LRG STERILE PKG (GAUZE/BANDAGES/DRESSINGS) ×6 IMPLANT
DRAPE INCISE 51X51 W/FILM STRL (DRAPES) ×1 IMPLANT
DRAPE OPHTHALMIC 77X100 STRL (CUSTOM PROCEDURE TRAY) ×2 IMPLANT
FILTER BLUE MILLIPORE (MISCELLANEOUS) IMPLANT
FILTER STRAW FLUID ASPIR (MISCELLANEOUS) IMPLANT
FORCEPS ECKARDT ILM 25G SERR (OPHTHALMIC RELATED) IMPLANT
GLOVE SS BIOGEL STRL SZ 7 (GLOVE) ×1 IMPLANT
GLOVE SUPERSENSE BIOGEL SZ 7 (GLOVE) ×1
GLOVE SURG 8.5 LATEX PF (GLOVE) ×3 IMPLANT
GOWN STRL NON-REIN LRG LVL3 (GOWN DISPOSABLE) ×6 IMPLANT
ILLUMINATOR CHOW PICK 25GA (MISCELLANEOUS) ×2 IMPLANT
KIT BASIN OR (CUSTOM PROCEDURE TRAY) ×2 IMPLANT
KIT ROOM TURNOVER OR (KITS) ×2 IMPLANT
KNIFE CRESCENT 2.5 55 ANG (BLADE) IMPLANT
LENS BIOM SUPER VIEW SET DISP (OPHTHALMIC RELATED) ×1 IMPLANT
MARKER SKIN DUAL TIP RULER LAB (MISCELLANEOUS) ×2 IMPLANT
MASK EYE SHIELD (GAUZE/BANDAGES/DRESSINGS) ×1 IMPLANT
MICROPICK 25G (MISCELLANEOUS)
NDL 18GX1X1/2 (RX/OR ONLY) (NEEDLE) ×1 IMPLANT
NDL 25GX 5/8IN NON SAFETY (NEEDLE) ×1 IMPLANT
NDL FILTER BLUNT 18X1 1/2 (NEEDLE) ×1 IMPLANT
NDL HYPO 30X.5 LL (NEEDLE) ×1 IMPLANT
NEEDLE 18GX1X1/2 (RX/OR ONLY) (NEEDLE) ×2 IMPLANT
NEEDLE 25GX 5/8IN NON SAFETY (NEEDLE) ×2 IMPLANT
NEEDLE 27GAX1X1/2 (NEEDLE) IMPLANT
NEEDLE FILTER BLUNT 18X 1/2SAF (NEEDLE) ×1
NEEDLE FILTER BLUNT 18X1 1/2 (NEEDLE) ×1 IMPLANT
NEEDLE HYPO 30X.5 LL (NEEDLE) ×2 IMPLANT
NS IRRIG 1000ML POUR BTL (IV SOLUTION) ×2 IMPLANT
PACK VITRECTOMY CUSTOM (CUSTOM PROCEDURE TRAY) ×2 IMPLANT
PAD ARMBOARD 7.5X6 YLW CONV (MISCELLANEOUS) ×4 IMPLANT
PAD EYE OVAL STERILE LF (GAUZE/BANDAGES/DRESSINGS) ×1 IMPLANT
PAK VITRECTOMY PIK 25 GA (OPHTHALMIC RELATED) ×2 IMPLANT
PENCIL BIPOLAR 25GA STR DISP (OPHTHALMIC RELATED) IMPLANT
PICK MICROPICK 25G (MISCELLANEOUS) IMPLANT
PROBE DIRECTIONAL LASER (MISCELLANEOUS) ×1 IMPLANT
REPL STRA BRUSH NDL (NEEDLE) IMPLANT
REPL STRA BRUSH NEEDLE (NEEDLE) IMPLANT
RESERVOIR BACK FLUSH (MISCELLANEOUS) IMPLANT
ROLLS DENTAL (MISCELLANEOUS) ×4 IMPLANT
SCRAPER DIAMOND DUST MEMBRANE (MISCELLANEOUS) IMPLANT
SPONGE SURGIFOAM ABS GEL 12-7 (HEMOSTASIS) ×3 IMPLANT
STOPCOCK 4 WAY LG BORE MALE ST (IV SETS) IMPLANT
SUT CHROMIC 7 0 TG140 8 (SUTURE) IMPLANT
SUT ETHILON 10 0 CS140 6 (SUTURE) IMPLANT
SUT ETHILON 9 0 TG140 8 (SUTURE) IMPLANT
SUT POLY NON ABSORB 10-0 8 STR (SUTURE) IMPLANT
SUT SILK 4 0 RB 1 (SUTURE) IMPLANT
SYR 20CC LL (SYRINGE) ×2 IMPLANT
SYR 5ML LL (SYRINGE) IMPLANT
SYR BULB 3OZ (MISCELLANEOUS) ×2 IMPLANT
SYR TB 1ML LUER SLIP (SYRINGE) ×2 IMPLANT
SYRINGE 10CC LL (SYRINGE) IMPLANT
TAPE SURG TRANSPORE 1 IN (GAUZE/BANDAGES/DRESSINGS) IMPLANT
TAPE SURGICAL TRANSPORE 1 IN (GAUZE/BANDAGES/DRESSINGS) ×1
TOWEL OR 17X24 6PK STRL BLUE (TOWEL DISPOSABLE) ×6 IMPLANT
TROCAR CANNULA 25GA (CANNULA) IMPLANT
WATER STERILE IRR 1000ML POUR (IV SOLUTION) ×2 IMPLANT

## 2011-05-23 NOTE — Preoperative (Signed)
Beta Blockers   Reason not to administer Beta Blockers:Not Applicable 

## 2011-05-23 NOTE — H&P (Signed)
I examined the patient today and there is no change in the medical status 

## 2011-05-23 NOTE — Op Note (Signed)
NAME:  Lucas Clark, Lucas Clark NO.:  [id number]  MEDICAL RECORD NO.:  [id number]  LOCATION:  [facility]                         FACILITY:  [facility]  PHYSICIAN:  Teofil Maniaci D. Ashley Royalty, M.D. DATE OF BIRTH:  01-Jun-1950  DATE OF PROCEDURE: DATE OF DISCHARGE:                              OPERATIVE REPORT   ADMISSION DIAGNOSIS:  Vitreous hemorrhage, proliferative diabetic retinopathy, right eye.  PROCEDURE:  Pars plana vitrectomy, retinal photocoagulation gas fluid exchange, membrane peel in the right eye.  SURGEON:  Beulah Gandy. Ashley Royalty, M.D.  ASSISTANT:  Rosalie Doctor SA.  ANESTHESIA:  General.  DETAILS:  Usual prep and drape, 25-gauge trocars placed at 8, 10, and 2 o'clock.  Infusion at 8 o'clock.  The Provisc was placed on the corneal surface.  The pars plana vitrectomy was begun just behind the pseudophakia, blood and vitreous was encountered.  This was carefully removed under low suction and rapid cutting.  A concentration of blood was seen at 5 o'clock.  Scleral depression was used to gain access to this area.  The vitreous base was trimmed.  There were large red clots in this area.  There were removed with the vitreous cutter, the core vitrectomy was completed.  The surface proliferation was removed from the mid periphery with the lighted pick and the vitreous cutter.  Then, the far periphery was inspected and was trimmed.  The wide field biome viewing system was moved into place.  An additional vitreous was removed out to the vitreous base.  The endolaser was positioned in the eye, 1387 burns were placed around the retinal periphery.  The power was 1000 mW, 1000 microns each and 0.1 seconds each.  Once all blood was removed, the retina was inspected again and a weak spot was seen at 12 o'clock.  This had been treated with the endolaser possibly a tear.  The source of hemorrhage appeared to be 5 o'clock at the vitreous base, and I suspect that this could have been from a  intraocular lens footplate but cannot be certain.  Neovascularization of the retina was not seen.  Once the vitreous cavity was cleared, 30% gas fluid exchange was carried out. The instruments removed from the eye and the trocars were removed from the eye.  The wounds were tested and found to be secured.  Polymyxin and gentamicin were irrigated into tenon space, atropine solution was applied.  Marcaine was injected around the globe for postop pain. Decadron 10 mg was injected into the lower subconjunctival space. Closing pressure was 10 with a Baer care tonometer.  COMPLICATIONS:  None.  DURATION:  1 hour.  The patient was awakened, taken to recovery in satisfactory condition.     Beulah Gandy. Ashley Royalty, M.D.     JDM/MEDQ  D:  05/23/2011  T:  05/23/2011  Job:  [id number]

## 2011-05-23 NOTE — Anesthesia Postprocedure Evaluation (Signed)
  Anesthesia Post-op Note  Patient: Lucas Clark  Procedure(s) Performed: Procedure(s) (LRB): PARS PLANA VITRECTOMY WITH 25 GAUGE (Right) GAS/FLUID EXCHANGE (Right) MEMBRANE PEEL (Right)  Patient Location: PACU  Anesthesia Type: General  Level of Consciousness: awake  Airway and Oxygen Therapy: Patient Spontanous Breathing and Patient connected to nasal cannula oxygen  Post-op Pain: mild  Post-op Assessment: Post-op Vital signs reviewed, Patient's Cardiovascular Status Stable, Respiratory Function Stable and Patent Airway  Post-op Vital Signs: Reviewed and stable  Complications: No apparent anesthesia complications

## 2011-05-23 NOTE — Anesthesia Procedure Notes (Signed)
Procedure Name: Intubation Date/Time: 05/23/2011 11:50 AM Performed by: Margaree Mackintosh Pre-anesthesia Checklist: Patient identified, Timeout performed, Emergency Drugs available, Suction available and Patient being monitored Patient Re-evaluated:Patient Re-evaluated prior to inductionOxygen Delivery Method: Circle system utilized Preoxygenation: Pre-oxygenation with 100% oxygen Intubation Type: IV induction Ventilation: Two handed mask ventilation required and Oral airway inserted - appropriate to patient size Laryngoscope Size: Mac and 4 Grade View: Grade II Tube type: Oral Tube size: 7.5 mm Number of attempts: 1 Airway Equipment and Method: Stylet Placement Confirmation: ETT inserted through vocal cords under direct vision,  positive ETCO2 and breath sounds checked- equal and bilateral Secured at: 22 cm Tube secured with: Tape Dental Injury: Teeth and Oropharynx as per pre-operative assessment

## 2011-05-23 NOTE — Anesthesia Preprocedure Evaluation (Signed)
Anesthesia Evaluation  Patient identified by MRN, date of birth, ID band Patient awake    Reviewed: Allergy & Precautions, H&P , NPO status , Patient's Chart, lab work & pertinent test results  Airway Mallampati: II TM Distance: >3 FB Neck ROM: Full    Dental No notable dental hx. (+) Edentulous Upper, Partial Lower and Dental Advisory Given   Pulmonary sleep apnea ,  breath sounds clear to auscultation  Pulmonary exam normal       Cardiovascular hypertension, On Medications negative cardio ROS  Rhythm:Regular Rate:Normal     Neuro/Psych negative neurological ROS  negative psych ROS   GI/Hepatic negative GI ROS, Neg liver ROS,   Endo/Other  Diabetes mellitus-, Well Controlled, Type 2, Oral Hypoglycemic Agents  Renal/GU negative Renal ROS  negative genitourinary   Musculoskeletal   Abdominal (+) + obese,   Peds  Hematology negative hematology ROS (+)   Anesthesia Other Findings   Reproductive/Obstetrics negative OB ROS                           Anesthesia Physical Anesthesia Plan  ASA: III  Anesthesia Plan: General   Post-op Pain Management:    Induction: Intravenous  Airway Management Planned: Oral ETT  Additional Equipment:   Intra-op Plan:   Post-operative Plan: Extubation in OR  Informed Consent: I have reviewed the patients History and Physical, chart, labs and discussed the procedure including the risks, benefits and alternatives for the proposed anesthesia with the patient or authorized representative who has indicated his/her understanding and acceptance.   Dental advisory given  Plan Discussed with: CRNA  Anesthesia Plan Comments:         Anesthesia Quick Evaluation

## 2011-05-23 NOTE — Transfer of Care (Signed)
Immediate Anesthesia Transfer of Care Note  Patient: Lucas Clark  Procedure(s) Performed: Procedure(s) (LRB): PARS PLANA VITRECTOMY WITH 25 GAUGE (Right) GAS/FLUID EXCHANGE (Right) LASER PHOTO ABLATION (Right) MEMBRANE PEEL (Right)  Patient Location: PACU  Anesthesia Type: General  Level of Consciousness: awake, alert  and oriented  Airway & Oxygen Therapy: Patient Spontanous Breathing and Patient connected to nasal cannula oxygen  Post-op Assessment: Report given to PACU RN and Post -op Vital signs reviewed and stable  Post vital signs: Reviewed and stable  Complications: No apparent anesthesia complications

## 2011-05-23 NOTE — Procedures (Signed)
Brief Operative note   Preoperative diagnosis:  Pre-Op Diagnosis Codes:    * Vitreous hemorrhage [379.23] Postoperative diagnosis  Post-Op Diagnosis Codes:    * Vitreous hemorrhage [379.23]  Procedures:  Pars Plana Virectomy with laser and gas  Surgeon:  Sherrie George, MD...  Assistant:  Rosalie Doctor SA   Anesthesia: General  Specimen: none  Estimated blood loss:  1cc  Complications: none  Patient sent to PACU in good condition  Composed by Sherrie George MD  Dictation number: [phone]

## 2011-05-24 ENCOUNTER — Encounter (HOSPITAL_COMMUNITY): Payer: Self-pay | Admitting: Ophthalmology

## 2011-05-24 LAB — GLUCOSE, CAPILLARY: Glucose-Capillary: 152 mg/dL — ABNORMAL HIGH (ref 70–99)

## 2011-05-24 MED ORDER — BRIMONIDINE TARTRATE 0.2 % OP SOLN: 1.0000 [drp] | Freq: Two times a day (BID) | OPHTHALMIC | Status: AC

## 2011-05-24 MED ORDER — GATIFLOXACIN 0.5 % OP SOLN: 1.0000 [drp] | Freq: Four times a day (QID) | OPHTHALMIC | Status: DC

## 2011-05-24 MED ORDER — BACITRACIN-POLYMYXIN B 500-10000 UNIT/GM OP OINT: 1.0000 "application " | TOPICAL_OINTMENT | Freq: Four times a day (QID) | OPHTHALMIC | Status: AC

## 2011-05-24 MED ORDER — LATANOPROST 0.005 % OP SOLN: 1.0000 [drp] | Freq: Every day | OPHTHALMIC | Status: AC

## 2011-05-24 NOTE — Progress Notes (Signed)
05/24/2011, 6:39 AM  Mental Status:  Awake, Alert, Oriented  Anterior segment: Cornea  Clear    Anterior Chamber Clear    Lens:    IOL Intra Ocular Pressure 29 mmHg with Tonopen  Vitreous: Clear 30%gas bubble Retina:  Attached Good laser reaction  Impression: Excellent result Retina attached  Final Diagnosis: Principal Problem:  *Vitreous hemorrhage of right eye   Plan: start post operative eye drops.  Discharge to home.  Give post operative instructions  Sherrie George 05/24/2011, 6:39 AM

## 2011-05-24 NOTE — Progress Notes (Addendum)
Discharge instructions reviewed with patient, including how to administer eye drops. States I've already reviewed the eyedrops with the previous nurse. Able to point out instructions on med bags. Verbalizes understanding. Printed AVS signed and given to patient. Pt discharge to home via wheelchair.

## 2011-05-24 NOTE — Discharge Summary (Signed)
Discharge summary not needed on [facility] patients per medical records. 

## 2011-05-31 ENCOUNTER — Inpatient Hospital Stay (INDEPENDENT_AMBULATORY_CARE_PROVIDER_SITE_OTHER): Payer: Medicare Other | Admitting: Ophthalmology

## 2011-05-31 DIAGNOSIS — H4310 Vitreous hemorrhage, unspecified eye: Secondary | ICD-10-CM

## 2011-06-20 ENCOUNTER — Encounter (INDEPENDENT_AMBULATORY_CARE_PROVIDER_SITE_OTHER): Payer: Medicare Other | Admitting: Ophthalmology

## 2011-06-20 DIAGNOSIS — E1165 Type 2 diabetes mellitus with hyperglycemia: Secondary | ICD-10-CM

## 2011-06-20 DIAGNOSIS — E11359 Type 2 diabetes mellitus with proliferative diabetic retinopathy without macular edema: Secondary | ICD-10-CM

## 2011-06-21 ENCOUNTER — Encounter (INDEPENDENT_AMBULATORY_CARE_PROVIDER_SITE_OTHER): Payer: Medicare Other | Admitting: Ophthalmology

## 2011-08-30 ENCOUNTER — Encounter (INDEPENDENT_AMBULATORY_CARE_PROVIDER_SITE_OTHER): Payer: Medicare Other | Admitting: Ophthalmology

## 2011-08-30 DIAGNOSIS — H2510 Age-related nuclear cataract, unspecified eye: Secondary | ICD-10-CM

## 2011-08-30 DIAGNOSIS — H35039 Hypertensive retinopathy, unspecified eye: Secondary | ICD-10-CM

## 2011-08-30 DIAGNOSIS — E1139 Type 2 diabetes mellitus with other diabetic ophthalmic complication: Secondary | ICD-10-CM

## 2011-08-30 DIAGNOSIS — H43819 Vitreous degeneration, unspecified eye: Secondary | ICD-10-CM

## 2011-08-30 DIAGNOSIS — E11359 Type 2 diabetes mellitus with proliferative diabetic retinopathy without macular edema: Secondary | ICD-10-CM

## 2011-08-30 DIAGNOSIS — I10 Essential (primary) hypertension: Secondary | ICD-10-CM

## 2011-08-30 DIAGNOSIS — H33309 Unspecified retinal break, unspecified eye: Secondary | ICD-10-CM

## 2011-08-30 DIAGNOSIS — E11319 Type 2 diabetes mellitus with unspecified diabetic retinopathy without macular edema: Secondary | ICD-10-CM

## 2012-09-02 ENCOUNTER — Ambulatory Visit (INDEPENDENT_AMBULATORY_CARE_PROVIDER_SITE_OTHER): Payer: Medicare Other | Admitting: Ophthalmology

## 2014-02-12 DIAGNOSIS — I10 Essential (primary) hypertension: Secondary | ICD-10-CM | POA: Diagnosis not present

## 2014-02-12 DIAGNOSIS — E119 Type 2 diabetes mellitus without complications: Secondary | ICD-10-CM | POA: Diagnosis not present

## 2014-02-12 DIAGNOSIS — F1021 Alcohol dependence, in remission: Secondary | ICD-10-CM | POA: Diagnosis not present

## 2014-03-23 DIAGNOSIS — J209 Acute bronchitis, unspecified: Secondary | ICD-10-CM | POA: Diagnosis not present

## 2014-04-03 ENCOUNTER — Ambulatory Visit (HOSPITAL_COMMUNITY)
Admission: RE | Admit: 2014-04-03 | Discharge: 2014-04-03 | Disposition: A | Discharge status: Home / Self Care | Payer: Commercial Managed Care - HMO | Source: Ambulatory Visit | Attending: Internal Medicine | Admitting: Internal Medicine

## 2014-04-03 ENCOUNTER — Other Ambulatory Visit (HOSPITAL_COMMUNITY): Payer: Self-pay | Admitting: Internal Medicine

## 2014-04-03 DIAGNOSIS — R0989 Other specified symptoms and signs involving the circulatory and respiratory systems: Secondary | ICD-10-CM

## 2014-04-03 DIAGNOSIS — M5134 Other intervertebral disc degeneration, thoracic region: Secondary | ICD-10-CM | POA: Diagnosis not present

## 2014-06-04 DIAGNOSIS — E785 Hyperlipidemia, unspecified: Secondary | ICD-10-CM | POA: Diagnosis not present

## 2014-06-04 DIAGNOSIS — I6789 Other cerebrovascular disease: Secondary | ICD-10-CM | POA: Diagnosis not present

## 2014-06-04 DIAGNOSIS — Z79899 Other long term (current) drug therapy: Secondary | ICD-10-CM | POA: Diagnosis not present

## 2014-06-04 DIAGNOSIS — E119 Type 2 diabetes mellitus without complications: Secondary | ICD-10-CM | POA: Diagnosis not present

## 2014-06-04 DIAGNOSIS — I10 Essential (primary) hypertension: Secondary | ICD-10-CM | POA: Diagnosis not present

## 2014-06-11 DIAGNOSIS — E785 Hyperlipidemia, unspecified: Secondary | ICD-10-CM | POA: Diagnosis not present

## 2014-06-11 DIAGNOSIS — I10 Essential (primary) hypertension: Secondary | ICD-10-CM | POA: Diagnosis not present

## 2014-06-11 DIAGNOSIS — F1021 Alcohol dependence, in remission: Secondary | ICD-10-CM | POA: Diagnosis not present

## 2014-06-11 DIAGNOSIS — E119 Type 2 diabetes mellitus without complications: Secondary | ICD-10-CM | POA: Diagnosis not present

## 2014-09-14 DIAGNOSIS — E119 Type 2 diabetes mellitus without complications: Secondary | ICD-10-CM | POA: Diagnosis not present

## 2014-09-22 DIAGNOSIS — E1129 Type 2 diabetes mellitus with other diabetic kidney complication: Secondary | ICD-10-CM | POA: Diagnosis not present

## 2014-09-22 DIAGNOSIS — F1021 Alcohol dependence, in remission: Secondary | ICD-10-CM | POA: Diagnosis not present

## 2014-09-22 DIAGNOSIS — Z6841 Body Mass Index (BMI) 40.0 and over, adult: Secondary | ICD-10-CM | POA: Diagnosis not present

## 2015-01-05 DIAGNOSIS — E119 Type 2 diabetes mellitus without complications: Secondary | ICD-10-CM | POA: Diagnosis not present

## 2015-01-28 DIAGNOSIS — F1021 Alcohol dependence, in remission: Secondary | ICD-10-CM | POA: Diagnosis not present

## 2015-01-28 DIAGNOSIS — E1129 Type 2 diabetes mellitus with other diabetic kidney complication: Secondary | ICD-10-CM | POA: Diagnosis not present

## 2015-05-04 DIAGNOSIS — R229 Localized swelling, mass and lump, unspecified: Secondary | ICD-10-CM | POA: Diagnosis not present

## 2015-05-04 DIAGNOSIS — E1129 Type 2 diabetes mellitus with other diabetic kidney complication: Secondary | ICD-10-CM | POA: Diagnosis not present

## 2015-05-04 DIAGNOSIS — Z6841 Body Mass Index (BMI) 40.0 and over, adult: Secondary | ICD-10-CM | POA: Diagnosis not present

## 2015-05-26 DIAGNOSIS — E119 Type 2 diabetes mellitus without complications: Secondary | ICD-10-CM | POA: Diagnosis not present

## 2015-06-29 DIAGNOSIS — E1129 Type 2 diabetes mellitus with other diabetic kidney complication: Secondary | ICD-10-CM | POA: Diagnosis not present

## 2015-06-29 DIAGNOSIS — I10 Essential (primary) hypertension: Secondary | ICD-10-CM | POA: Diagnosis not present

## 2015-06-29 DIAGNOSIS — F419 Anxiety disorder, unspecified: Secondary | ICD-10-CM | POA: Diagnosis not present

## 2015-09-23 DIAGNOSIS — E119 Type 2 diabetes mellitus without complications: Secondary | ICD-10-CM | POA: Diagnosis not present

## 2015-09-30 DIAGNOSIS — F339 Major depressive disorder, recurrent, unspecified: Secondary | ICD-10-CM | POA: Diagnosis not present

## 2015-09-30 DIAGNOSIS — I10 Essential (primary) hypertension: Secondary | ICD-10-CM | POA: Diagnosis not present

## 2015-09-30 DIAGNOSIS — E1129 Type 2 diabetes mellitus with other diabetic kidney complication: Secondary | ICD-10-CM | POA: Diagnosis not present

## 2015-10-28 DIAGNOSIS — Z23 Encounter for immunization: Secondary | ICD-10-CM | POA: Diagnosis not present

## 2015-10-28 DIAGNOSIS — F339 Major depressive disorder, recurrent, unspecified: Secondary | ICD-10-CM | POA: Diagnosis not present

## 2015-12-07 DIAGNOSIS — E119 Type 2 diabetes mellitus without complications: Secondary | ICD-10-CM | POA: Diagnosis not present

## 2016-01-06 ENCOUNTER — Other Ambulatory Visit (HOSPITAL_COMMUNITY): Payer: Self-pay | Admitting: Internal Medicine

## 2016-01-06 ENCOUNTER — Ambulatory Visit (HOSPITAL_COMMUNITY)
Admission: RE | Admit: 2016-01-06 | Discharge: 2016-01-06 | Disposition: A | Discharge status: Home / Self Care | Payer: Commercial Managed Care - HMO | Source: Ambulatory Visit | Attending: Internal Medicine | Admitting: Internal Medicine

## 2016-01-06 DIAGNOSIS — W3400XA Accidental discharge from unspecified firearms or gun, initial encounter: Secondary | ICD-10-CM

## 2016-01-06 DIAGNOSIS — S61432A Puncture wound without foreign body of left hand, initial encounter: Secondary | ICD-10-CM | POA: Insufficient documentation

## 2016-01-06 DIAGNOSIS — S61402A Unspecified open wound of left hand, initial encounter: Secondary | ICD-10-CM | POA: Diagnosis not present

## 2016-01-06 DIAGNOSIS — X58XXXA Exposure to other specified factors, initial encounter: Secondary | ICD-10-CM | POA: Diagnosis not present

## 2016-01-12 DIAGNOSIS — S61402D Unspecified open wound of left hand, subsequent encounter: Secondary | ICD-10-CM | POA: Diagnosis not present

## 2016-04-26 DIAGNOSIS — E119 Type 2 diabetes mellitus without complications: Secondary | ICD-10-CM | POA: Diagnosis not present

## 2016-05-04 DIAGNOSIS — E1129 Type 2 diabetes mellitus with other diabetic kidney complication: Secondary | ICD-10-CM | POA: Diagnosis not present

## 2016-05-04 DIAGNOSIS — I10 Essential (primary) hypertension: Secondary | ICD-10-CM | POA: Diagnosis not present

## 2016-05-04 DIAGNOSIS — Z6841 Body Mass Index (BMI) 40.0 and over, adult: Secondary | ICD-10-CM | POA: Diagnosis not present

## 2016-05-14 ENCOUNTER — Encounter (HOSPITAL_COMMUNITY): Payer: Self-pay | Admitting: Anesthesiology

## 2016-05-14 ENCOUNTER — Emergency Department (HOSPITAL_COMMUNITY): Payer: Medicare HMO

## 2016-05-14 ENCOUNTER — Observation Stay (HOSPITAL_COMMUNITY)
Admission: EM | Admit: 2016-05-14 | Discharge: 2016-05-16 | Disposition: A | Discharge status: Home / Self Care | Payer: Medicare HMO | Attending: Urology | Admitting: Urology

## 2016-05-14 ENCOUNTER — Encounter (HOSPITAL_COMMUNITY)
Admission: EM | Disposition: A | Discharge status: Home / Self Care | Payer: Self-pay | Source: Home / Self Care | Attending: Emergency Medicine

## 2016-05-14 ENCOUNTER — Encounter (HOSPITAL_COMMUNITY): Payer: Self-pay | Admitting: Emergency Medicine

## 2016-05-14 DIAGNOSIS — Q438 Other specified congenital malformations of intestine: Secondary | ICD-10-CM | POA: Diagnosis not present

## 2016-05-14 DIAGNOSIS — N135 Crossing vessel and stricture of ureter without hydronephrosis: Secondary | ICD-10-CM

## 2016-05-14 DIAGNOSIS — F419 Anxiety disorder, unspecified: Secondary | ICD-10-CM | POA: Diagnosis not present

## 2016-05-14 DIAGNOSIS — Z8673 Personal history of transient ischemic attack (TIA), and cerebral infarction without residual deficits: Secondary | ICD-10-CM | POA: Insufficient documentation

## 2016-05-14 DIAGNOSIS — E7800 Pure hypercholesterolemia, unspecified: Secondary | ICD-10-CM | POA: Diagnosis not present

## 2016-05-14 DIAGNOSIS — Z79899 Other long term (current) drug therapy: Secondary | ICD-10-CM | POA: Diagnosis not present

## 2016-05-14 DIAGNOSIS — F329 Major depressive disorder, single episode, unspecified: Secondary | ICD-10-CM | POA: Insufficient documentation

## 2016-05-14 DIAGNOSIS — G4733 Obstructive sleep apnea (adult) (pediatric): Secondary | ICD-10-CM | POA: Insufficient documentation

## 2016-05-14 DIAGNOSIS — Z881 Allergy status to other antibiotic agents status: Secondary | ICD-10-CM | POA: Diagnosis not present

## 2016-05-14 DIAGNOSIS — R911 Solitary pulmonary nodule: Secondary | ICD-10-CM | POA: Diagnosis not present

## 2016-05-14 DIAGNOSIS — E119 Type 2 diabetes mellitus without complications: Secondary | ICD-10-CM | POA: Diagnosis not present

## 2016-05-14 DIAGNOSIS — K760 Fatty (change of) liver, not elsewhere classified: Secondary | ICD-10-CM | POA: Insufficient documentation

## 2016-05-14 DIAGNOSIS — M8580 Other specified disorders of bone density and structure, unspecified site: Secondary | ICD-10-CM | POA: Insufficient documentation

## 2016-05-14 DIAGNOSIS — N200 Calculus of kidney: Secondary | ICD-10-CM

## 2016-05-14 DIAGNOSIS — H4311 Vitreous hemorrhage, right eye: Secondary | ICD-10-CM | POA: Insufficient documentation

## 2016-05-14 DIAGNOSIS — K5900 Constipation, unspecified: Secondary | ICD-10-CM | POA: Diagnosis not present

## 2016-05-14 DIAGNOSIS — Z87891 Personal history of nicotine dependence: Secondary | ICD-10-CM | POA: Diagnosis not present

## 2016-05-14 DIAGNOSIS — G2581 Restless legs syndrome: Secondary | ICD-10-CM | POA: Insufficient documentation

## 2016-05-14 DIAGNOSIS — J45909 Unspecified asthma, uncomplicated: Secondary | ICD-10-CM | POA: Insufficient documentation

## 2016-05-14 DIAGNOSIS — I119 Hypertensive heart disease without heart failure: Secondary | ICD-10-CM | POA: Insufficient documentation

## 2016-05-14 DIAGNOSIS — N132 Hydronephrosis with renal and ureteral calculous obstruction: Secondary | ICD-10-CM | POA: Diagnosis not present

## 2016-05-14 DIAGNOSIS — N179 Acute kidney failure, unspecified: Secondary | ICD-10-CM | POA: Diagnosis not present

## 2016-05-14 DIAGNOSIS — Z96643 Presence of artificial hip joint, bilateral: Secondary | ICD-10-CM | POA: Insufficient documentation

## 2016-05-14 DIAGNOSIS — Z888 Allergy status to other drugs, medicaments and biological substances status: Secondary | ICD-10-CM | POA: Diagnosis not present

## 2016-05-14 DIAGNOSIS — IMO0001 Reserved for inherently not codable concepts without codable children: Secondary | ICD-10-CM

## 2016-05-14 DIAGNOSIS — Z794 Long term (current) use of insulin: Secondary | ICD-10-CM | POA: Diagnosis not present

## 2016-05-14 DIAGNOSIS — N368 Other specified disorders of urethra: Secondary | ICD-10-CM | POA: Diagnosis not present

## 2016-05-14 DIAGNOSIS — N201 Calculus of ureter: Secondary | ICD-10-CM

## 2016-05-14 DIAGNOSIS — K8020 Calculus of gallbladder without cholecystitis without obstruction: Secondary | ICD-10-CM | POA: Diagnosis not present

## 2016-05-14 DIAGNOSIS — N209 Urinary calculus, unspecified: Secondary | ICD-10-CM

## 2016-05-14 DIAGNOSIS — N1330 Unspecified hydronephrosis: Secondary | ICD-10-CM | POA: Diagnosis present

## 2016-05-14 LAB — CBC WITH DIFFERENTIAL/PLATELET
BASOS ABS: 0 10*3/uL (ref 0.0–0.1)
BASOS PCT: 0 %
EOS ABS: 0 10*3/uL (ref 0.0–0.7)
EOS PCT: 0 %
HCT: 43.2 % (ref 39.0–52.0)
Hemoglobin: 14.9 g/dL (ref 13.0–17.0)
LYMPHS PCT: 9 %
Lymphs Abs: 1.2 10*3/uL (ref 0.7–4.0)
MCH: 32.1 pg (ref 26.0–34.0)
MCHC: 34.5 g/dL (ref 30.0–36.0)
MCV: 93.1 fL (ref 78.0–100.0)
Monocytes Absolute: 0.7 10*3/uL (ref 0.1–1.0)
Monocytes Relative: 6 %
Neutro Abs: 11.3 10*3/uL — ABNORMAL HIGH (ref 1.7–7.7)
Neutrophils Relative %: 85 %
PLATELETS: 160 10*3/uL (ref 150–400)
RBC: 4.64 MIL/uL (ref 4.22–5.81)
RDW: 14.8 % (ref 11.5–15.5)
WBC: 13.3 10*3/uL — ABNORMAL HIGH (ref 4.0–10.5)

## 2016-05-14 LAB — BASIC METABOLIC PANEL
Anion gap: 11 (ref 5–15)
BUN: 28 mg/dL — AB (ref 6–20)
CALCIUM: 9.1 mg/dL (ref 8.9–10.3)
CO2: 24 mmol/L (ref 22–32)
CREATININE: 2.31 mg/dL — AB (ref 0.61–1.24)
Chloride: 98 mmol/L — ABNORMAL LOW (ref 101–111)
GFR calc Af Amer: 32 mL/min — ABNORMAL LOW (ref >60)
GFR calc non Af Amer: 28 mL/min — ABNORMAL LOW (ref >60)
GLUCOSE: 157 mg/dL — AB (ref 65–99)
Potassium: 5.2 mmol/L — ABNORMAL HIGH (ref 3.5–5.1)
Sodium: 133 mmol/L — ABNORMAL LOW (ref 135–145)

## 2016-05-14 LAB — URINALYSIS, ROUTINE W REFLEX MICROSCOPIC
GLUCOSE, UA: 100 mg/dL — AB
KETONES UR: 15 mg/dL — AB
Nitrite: POSITIVE — AB
Protein, ur: >300 mg/dL — AB
Specific Gravity, Urine: 1.01 (ref 1.005–1.030)
pH: 6.5 (ref 5.0–8.0)

## 2016-05-14 LAB — URINALYSIS, MICROSCOPIC (REFLEX)

## 2016-05-14 LAB — CBG MONITORING, ED: GLUCOSE-CAPILLARY: 174 mg/dL — AB (ref 65–99)

## 2016-05-14 SURGERY — CYSTOSCOPY, WITH STENT INSERTION
Anesthesia: General | Laterality: Bilateral

## 2016-05-14 MED ORDER — HYDROMORPHONE HCL 1 MG/ML IJ SOLN
INTRAMUSCULAR | Status: AC
  Administered 2016-05-14: 1 mg via INTRAVENOUS
  Filled 2016-05-14: qty 1

## 2016-05-14 MED ORDER — SODIUM CHLORIDE 0.9 % IV SOLN: INTRAVENOUS | Status: DC

## 2016-05-14 MED ORDER — ONDANSETRON HCL 4 MG/2ML IJ SOLN
INTRAMUSCULAR | Status: AC
  Administered 2016-05-14: 4 mg via INTRAVENOUS
  Filled 2016-05-14: qty 2

## 2016-05-14 MED ORDER — HYDROMORPHONE HCL 1 MG/ML IJ SOLN
1.0000 mg | INTRAMUSCULAR | Status: DC | PRN
  Administered 2016-05-14: 1 mg via INTRAVENOUS
  Filled 2016-05-14: qty 1

## 2016-05-14 MED ORDER — LIDOCAINE HCL 2 % EX GEL
1.0000 "application " | Freq: Once | CUTANEOUS | Status: AC
  Administered 2016-05-14: 1 via URETHRAL
  Filled 2016-05-14: qty 10

## 2016-05-14 MED ORDER — METFORMIN HCL 500 MG PO TABS: 500.0000 mg | ORAL_TABLET | Freq: Two times a day (BID) | ORAL | Status: DC

## 2016-05-14 MED ORDER — PANTOPRAZOLE SODIUM 40 MG IV SOLR: 40.0000 mg | Freq: Once | INTRAVENOUS | Status: DC

## 2016-05-14 MED ORDER — HYDROMORPHONE HCL 2 MG/ML IJ SOLN
1.0000 mg | INTRAMUSCULAR | Status: DC | PRN
  Administered 2016-05-14: 1 mg via INTRAVENOUS
  Filled 2016-05-14: qty 1

## 2016-05-14 MED ORDER — INSULIN ASPART 100 UNIT/ML ~~LOC~~ SOLN
0.0000 [IU] | SUBCUTANEOUS | Status: DC
  Administered 2016-05-15: 7 [IU] via SUBCUTANEOUS
  Administered 2016-05-15 (×4): 4 [IU] via SUBCUTANEOUS
  Administered 2016-05-16 (×2): 3 [IU] via SUBCUTANEOUS
  Administered 2016-05-16 (×2): 4 [IU] via SUBCUTANEOUS
  Administered 2016-05-16: 7 [IU] via SUBCUTANEOUS

## 2016-05-14 MED ORDER — HYDROMORPHONE HCL 1 MG/ML IJ SOLN
1.0000 mg | Freq: Once | INTRAMUSCULAR | Status: AC
  Administered 2016-05-14: 1 mg via INTRAVENOUS

## 2016-05-14 MED ORDER — AMLODIPINE BESYLATE 5 MG PO TABS
5.0000 mg | ORAL_TABLET | Freq: Every day | ORAL | Status: DC
  Administered 2016-05-15 – 2016-05-16 (×2): 5 mg via ORAL
  Filled 2016-05-14 (×2): qty 1

## 2016-05-14 MED ORDER — TIOTROPIUM BROMIDE MONOHYDRATE 18 MCG IN CAPS
18.0000 ug | ORAL_CAPSULE | Freq: Every day | RESPIRATORY_TRACT | Status: DC
  Administered 2016-05-15 – 2016-05-16 (×2): 18 ug via RESPIRATORY_TRACT
  Filled 2016-05-14: qty 5

## 2016-05-14 MED ORDER — CLONAZEPAM 1 MG PO TABS
1.0000 mg | ORAL_TABLET | Freq: Three times a day (TID) | ORAL | Status: DC | PRN
  Administered 2016-05-15: 1 mg via ORAL
  Filled 2016-05-14: qty 1

## 2016-05-14 MED ORDER — CEFTRIAXONE SODIUM 1 G IJ SOLR
1.0000 g | Freq: Once | INTRAMUSCULAR | Status: AC
  Administered 2016-05-14: 1 g via INTRAVENOUS
  Filled 2016-05-14: qty 10

## 2016-05-14 MED ORDER — ONDANSETRON HCL 4 MG PO TABS: 4.0000 mg | ORAL_TABLET | Freq: Three times a day (TID) | ORAL | Status: DC | PRN

## 2016-05-14 MED ORDER — ONDANSETRON HCL 4 MG/2ML IJ SOLN
4.0000 mg | Freq: Once | INTRAMUSCULAR | Status: AC
  Administered 2016-05-14: 4 mg via INTRAVENOUS

## 2016-05-14 MED ORDER — SODIUM CHLORIDE 0.9 % IV BOLUS (SEPSIS)
1000.0000 mL | Freq: Once | INTRAVENOUS | Status: DC
Start: 2016-05-14 — End: 2016-05-14

## 2016-05-14 NOTE — ED Triage Notes (Signed)
Patient c/o urinary retention. Per patient has not voided since "yesterday afternoon"-Unable to give exact time. Patient reports pressure in lower abd and back. Denies any nausea or vomiting. Patient states urge to urinate and states blood when he strains to try and urinate. Per patient also has had some constipation with last BM 2.5 days ago, which is unusual for him.

## 2016-05-14 NOTE — ED Provider Notes (Signed)
[facility]-EMERGENCY DEPT Provider Note   CSN: [id number] Arrival date & time: 05/14/16  1242  By signing my name below, I, Bing Neighbors., attest that this documentation has been prepared under the direction and in the presence of Crist Fat, MD. Electronically signed: Bing Neighbors., ED Scribe. 05/14/16. 9:16 PM.   History   Chief Complaint Chief Complaint  Patient presents with  . Urinary Retention    HPI Lucas Clark is a 66 y.o. male who presents to the Emergency Department complaining of urinary retention with onset x1 day. Pt states that while straining to urinate he started noticing blood coming from his penis. He also reports being constipated for the past x3 days. Pt reports abdominal pain, constipation, appetite change. He denies any modifying factors. Pt denies fever, vomiting. He denies hx of heart complications, kidney complications.   The history is provided by the patient. No language interpreter was used.    Past Medical History:  Diagnosis Date  . Anxiety   . Arthritis    "little bit"  . Asthma    "a touch"  . Blood transfusion ~ 1964  . Bronchitis    "a touch"  . Depression   . High cholesterol   . Hypertension   . Restless legs   . Sleep apnea    last sleep study 2005. does not use cpap  . Stroke [facility]) ~2009   denies residual  . Type II diabetes mellitus [facility])     Patient Active Problem List   Diagnosis Date Noted  . Hydronephrosis 05/14/2016  . Vitreous hemorrhage of right eye (HCC) 05/23/2011  . RESTLESS LEGS SYNDROME 05/31/2007  . OBSTRUCTIVE SLEEP APNEA 02/15/2007    Past Surgical History:  Procedure Laterality Date  . EYE SURGERY  25 years ago   R cataract excision, eye lid surgery on L eye as child  . GAS/FLUID EXCHANGE  05/23/2011   Procedure: GAS/FLUID EXCHANGE;  Surgeon: Sherrie George, MD;  Location: [facility] OR;  Service: Ophthalmology;  Laterality: Right;  . PARS PLANA VITRECTOMY  05/23/11   gas fluid  exchange; membrane peel; right eye  . PARS PLANA VITRECTOMY  05/23/2011   Procedure: PARS PLANA VITRECTOMY WITH 25 GAUGE;  Surgeon: Sherrie George, MD;  Location: [facility] OR;  Service: Ophthalmology;  Laterality: Right;  Endolaser  . TOTAL HIP ARTHROPLASTY  08/10/2005   right  . TOTAL HIP ARTHROPLASTY  11/2005   left       Home Medications    Prior to Admission medications   Medication Sig Start Date End Date Taking? Authorizing Provider  amLODipine (NORVASC) 5 MG tablet Take 5 mg by mouth daily.   Yes Historical Provider, MD  clonazePAM (KLONOPIN) 1 MG tablet Take 1 mg by mouth 3 (three) times daily as needed. For anxiety   Yes Historical Provider, MD  losartan (COZAAR) 100 MG tablet Take 100 mg by mouth daily. 05/12/16  Yes Historical Provider, MD  metFORMIN (GLUCOPHAGE) 500 MG tablet Take 500 mg by mouth 2 (two) times daily with a meal.   Yes Historical Provider, MD  pioglitazone (ACTOS) 15 MG tablet Take 15 mg by mouth daily. 04/27/16  Yes Historical Provider, MD  pravastatin (PRAVACHOL) 40 MG tablet Take 40 mg by mouth daily.   Yes Historical Provider, MD  ropinirole (REQUIP) 5 MG tablet Take 15 mg by mouth at bedtime.   Yes Historical Provider, MD  sertraline (ZOLOFT) 50 MG tablet Take 50 mg by mouth daily.  Yes Historical Provider, MD  tiotropium (SPIRIVA) 18 MCG inhalation capsule Place 18 mcg into inhaler and inhale daily.   Yes Historical Provider, MD    Family History No family history on file.  Social History Social History  Substance Use Topics  . Smoking status: Former Smoker    Packs/day: 4.00    Years: 20.00    Types: Cigarettes    Quit date: 10/01/1991  . Smokeless tobacco: Never Used  . Alcohol use Yes     Comment: occasional     Allergies   Vancomycin; Lisinopril; and Rifampin   Review of Systems Review of Systems  Constitutional: Positive for appetite change. Negative for fever.  Gastrointestinal: Positive for abdominal pain and constipation. Negative for  vomiting.  Genitourinary: Positive for difficulty urinating.     Physical Exam Updated Vital Signs BP (!) 159/85 (BP Location: Right Arm)   Pulse (!) 102   Temp 98.9 F (37.2 C) (Oral)   Resp (!) 22   Ht  (1.626 m)   Wt 290 lb (131.5 kg)   SpO2 95%   BMI 49.78 kg/m   Physical Exam  Constitutional: He is oriented to person, place, and time. He appears well-developed and well-nourished.  HENT:  Head: Normocephalic and atraumatic.  Right Ear: External ear normal.  Left Ear: External ear normal.  Eyes: Conjunctivae and EOM are normal. Pupils are equal, round, and reactive to light.  Neck: Normal range of motion and phonation normal. Neck supple.  Cardiovascular: Normal rate, regular rhythm and normal heart sounds.   Pulmonary/Chest: Effort normal and breath sounds normal. He exhibits no bony tenderness.  Abdominal: Soft. There is no tenderness.  Musculoskeletal: Normal range of motion.       Right lower leg: He exhibits edema.       Left lower leg: He exhibits edema.  1+ pitting edema to the bilateral lower extremities.   Neurological: He is alert and oriented to person, place, and time. No cranial nerve deficit or sensory deficit. He exhibits normal muscle tone. Coordination normal.  Skin: Skin is warm, dry and intact. Bruising noted.  Bruises on the abdomen where pt injects insulin.  Psychiatric: He has a normal mood and affect. His behavior is normal. Judgment and thought content normal.  Nursing note and vitals reviewed.    ED Treatments / Results   DIAGNOSTIC STUDIES: Oxygen Saturation is 97% on RA, adequate by my interpretation.   COORDINATION OF CARE: 9:16 PM-Discussed next steps with pt. Pt verbalized understanding and is agreeable with the plan.    Labs (all labs ordered are listed, but only abnormal results are displayed) Labs Reviewed  URINALYSIS, ROUTINE W REFLEX MICROSCOPIC - Abnormal; Notable for the following:       Result Value   Color, Urine  RED (*)    APPearance TURBID (*)    Glucose, UA 100 (*)    Hgb urine dipstick LARGE (*)    Bilirubin Urine MODERATE (*)    Ketones, ur 15 (*)    Protein, ur >300 (*)    Nitrite POSITIVE (*)    Leukocytes, UA LARGE (*)    All other components within normal limits  BASIC METABOLIC PANEL - Abnormal; Notable for the following:    Sodium 133 (*)    Potassium 5.2 (*)    Chloride 98 (*)    Glucose, Bld 157 (*)    BUN 28 (*)    Creatinine, Ser 2.31 (*)    GFR calc non Af [place]  28 (*)    GFR calc Af Amer 32 (*)    All other components within normal limits  CBC WITH DIFFERENTIAL/PLATELET - Abnormal; Notable for the following:    WBC 13.3 (*)    Neutro Abs 11.3 (*)    All other components within normal limits  URINALYSIS, MICROSCOPIC (REFLEX) - Abnormal; Notable for the following:    Bacteria, UA FEW (*)    Squamous Epithelial / LPF 0-5 (*)    All other components within normal limits  CBG MONITORING, ED - Abnormal; Notable for the following:    Glucose-Capillary 174 (*)    All other components within normal limits  URINE CULTURE    EKG  EKG Interpretation None       Radiology Ct Renal Stone Study  Result Date: 05/14/2016 CLINICAL DATA:  Abdominal pain, unable to urinate, hematuria EXAM: CT ABDOMEN AND PELVIS WITHOUT CONTRAST TECHNIQUE: Multidetector CT imaging of the abdomen and pelvis was performed following the standard protocol without IV contrast. COMPARISON:  04/08/2010 FINDINGS: Lower chest: Lung bases shows a nodular lesion in left lower lobe posteriorly measures 1.5 by 1.3 cm. Malignancy cannot be excluded. Further evaluation with CT scan of the chest and PET scan is recommended. Hepatobiliary: Unenhanced liver shows no biliary ductal dilatation. Probable cyst is noted in right hepatic lobe medially measures 1.9 cm. Calcified gallstones are noted in gallbladder neck region the largest measures 8 mm. Pancreas: Unenhanced pancreas is atrophic without focal abnormality. Spleen:  Unenhanced spleen without focal abnormality. Adrenals/Urinary Tract: No adrenal gland mass. There is mild to moderate right hydronephrosis. Right perinephric stranding is noted. Axial image 46 and coronal image 79 there is calcified obstructive calculus in right UPJ/ proximal right ureter measures 10.4 m length by 6 mm transverse diameter. There is mild left hydronephrosis and left hydroureter. The left perinephric and periureteral stranding is noted. There is a coarse calcified calculus in lower pole of the left kidney coronal image 81 measures 1.3 cm. Larger nonobstructive calcified in left renal pelvis measures 1.8 by 1.8 cm. There is a tiny nonobstructive calculus in left UPJ measures 4 mm. Axial image 59 there is a calcified partially obstructive calculus in mid left ureter at the level of L5 vertebral body measures about 5 mm. Multiple same size calculi are noted along the left ureter be low this calculus please see sagittal image 91. At least 4 or 5 obstructive calculi are noted along left ureter for about 2 cm length. Bilateral distal ureter is small caliber. The urinary bladder is empty. There is a Foley catheter within decompressed urinary bladder. Stomach/Bowel: No small bowel obstruction. No thickened or dilated small bowel loops. Moderate stool noted within right colon and cecum. Terminal ileum is unremarkable. Normal appendix partially visualized in axial image 62 some colonic stool noted within transverse colon. No evidence of distal colitis or diverticulitis. Some colonic gas noted mid sigmoid colon. There is redundant sigmoid colon. Few diverticula are noted sigmoid colon without evidence of acute diverticulitis. Vascular/Lymphatic: Atherosclerotic calcifications of abdominal aorta and iliac arteries are noted. No aortic aneurysm. No retroperitoneal or mesenteric adenopathy. Reproductive: Small prostate gland calcifications are noted. Seminal vesicles are unremarkable. Other: There are extensive  metallic artifacts from bilateral hip prosthesis. Musculoskeletal: Sagittal images of the spine shows osteopenia and degenerative changes thoracolumbar spine. IMPRESSION: 1. There is moderate right hydronephrosis. There is 10 x 6 mm calcified obstructive calculus in right UPJ /proximal right ureter. Right perinephric stranding is noted. 2. Significant left nephrolithiasis. Mild left  hydronephrosis and left hydroureter. Larger nonobstructive calculus in left renal pelvis measures 1.8 x 1.8 cm. There is 4 mm partially obstructive calculus in left UPJ. Multiple obstructive calculi string like disposition are noted in mid left ureter please see coronal image 81 the largest measures 5 mm. Bilateral distal ureter is small caliber. Decompressed urinary bladder with a Foley catheter. 3. There is 1.5 cm nodular lesion in left lower lobe posteriorly. Malignancy cannot be excluded. Further correlation with CT scan of the chest and PET scan is recommended. 4. Normal appendix.  No pericecal inflammation. 5. No small bowel obstruction. 6. Mild degenerative changes thoracic spine. Bilateral hip prosthesis. 7. Cholelithiasis is noted. These results were called by telephone at the time of interpretation on 05/14/2016 at 6:40 pm to Dr. Mancel Bale , who verbally acknowledged these results. Electronically Signed   By: Natasha Mead M.D.   On: 05/14/2016 18:40    Procedures Procedures (including critical care time)  Medications Ordered in ED Medications  HYDROmorphone (DILAUDID) injection 1 mg (1 mg Intravenous Given 05/14/16 2013)  lidocaine (XYLOCAINE) 2 % jelly 1 application (1 application Urethral Given 05/14/16 1846)  ondansetron (ZOFRAN) injection 4 mg (4 mg Intravenous Given 05/14/16 1626)  HYDROmorphone (DILAUDID) injection 1 mg (1 mg Intravenous Given 05/14/16 1626)  cefTRIAXone (ROCEPHIN) 1 g in dextrose 5 % 50 mL IVPB (1 g Intravenous New Bag/Given 05/14/16 2013)     Initial Impression / Assessment and Plan / ED  Course  I have reviewed the triage vital signs and the nursing notes.  Pertinent labs & imaging results that were available during my care of the patient were reviewed by me and considered in my medical decision making (see chart for details).  Clinical Course as of May 15 2114  Sun May 14, 2016  1603 Catheterization per nursing, first with 14-gauge followed by three-way catheter, and irrigation.  Patient did not cause pain and return was blood-tinged fluid.  CT ordered to evaluate for hydronephrosis, and bladder inflow/outflow obstruction.  [EW]    Clinical Course User Index [EW] Mancel Bale, MD    Pt's bladder scan reveals 240 cc of urine in the bladder.  Foley catheter was placed but no urine returned.   Medications  HYDROmorphone (DILAUDID) injection 1 mg (1 mg Intravenous Given 05/14/16 2013)  lidocaine (XYLOCAINE) 2 % jelly 1 application (1 application Urethral Given 05/14/16 1846)  ondansetron (ZOFRAN) injection 4 mg (4 mg Intravenous Given 05/14/16 1626)  HYDROmorphone (DILAUDID) injection 1 mg (1 mg Intravenous Given 05/14/16 1626)  cefTRIAXone (ROCEPHIN) 1 g in dextrose 5 % 50 mL IVPB (1 g Intravenous New Bag/Given 05/14/16 2013)    Patient Vitals for the past 24 hrs:  BP Temp Temp src Pulse Resp SpO2 Height Weight  05/14/16 2105 (!) 159/85 - - (!) 102 (!) 22 95 % - -  05/14/16 1941 (!) 158/68 - - (!) 103 14 92 % - -  05/14/16 1303 (!) 177/92 98.9 F (37.2 C) Oral 99 (!) 24 97 % 5\' 4"  (1.626 m) 290 lb (131.5 kg)    Case discussed with Dr. Marlou Porch, Urology, who accepts patient in transfer.  9:18 PM Reevaluation with update and discussion. After initial assessment and treatment, an updated evaluation reveals he remains fairly comfortable. Findings discussed with patient. He agrees to transfer for treatment.Mancel Bale L     Final Clinical Impressions(s) / ED Diagnoses   Final diagnoses:  AKI (acute kidney injury) (HCC)  Ureter obstruction  Left ureteral stone  Right ureteral stone  Lung nodule < 6cm on CT  Calculus of gallbladder without cholecystitis without obstruction   Mild urethra bleeding without urinary output. Creatinine elevated from baseline and bilateral ureter obstruction/hydronephrosis from stones. Patient requires urgent intervention to treat.  Nursing Notes Reviewed/ Care Coordinated Applicable Imaging Reviewed Interpretation of Laboratory Data incorporated into ED treatment  Plan- Transfer to [facility], Urology, for treatment of bilateral ureter obstruction. Patient and family informed of incidental lung nodule and gall stones, and recommended f/u for them in 1-2 weeks.  New Prescriptions New Prescriptions   No medications on file   I personally performed the services described in this documentation, which was scribed in my presence. The recorded information has been reviewed and is accurate.    Mancel Bale, MD 05/15/16 1324

## 2016-05-14 NOTE — Progress Notes (Signed)
PHARMACIST - PHYSICIAN COMMUNICATION DR:  Louis Meckel CONCERNING:  METFORMIN SAFE ADMINISTRATION POLICY  RECOMMENDATION: Metformin has been placed on DISCONTINUE (rejected order) STATUS and should be reordered only after any of the conditions below are ruled out.  Current Safety recommendations include avoiding metformin for a minimum of 48 hours after the patient's exposure to intravenous contrast media for the following conditions:  . eGFR < 60 ml/min  . Liver disease, alcoholism, heart failure, intra-arterial administration of contrast  DESCRIPTION:  The Pharmacy Committee has adopted a policy that restricts the use of metformin in hospitalized patients until all the contraindications to administration have been ruled out. Specific contraindications are: '[x]'$  Serum creatinine ? 1.5 for males '[]'$  Serum creatinine ? 1.4 for females '[]'$  Shock, acute MI, sepsis, hypoxemia, dehydration '[]'$  Planned administration of intravenous iodinated contrast media when eGFR < 24m/min, Liver Disease, alcoholism, heart failure or intra-arterial administration of contrast '[]'$  Heart Failure patients with low EF '[]'$  Acute or chronic metabolic acidosis (including DKA)   GNani SkillernCrowford, RLawrence & Memorial Hospital4/15/2018 10:38 PM

## 2016-05-15 ENCOUNTER — Observation Stay (HOSPITAL_COMMUNITY): Payer: Medicare HMO | Admitting: Registered Nurse

## 2016-05-15 ENCOUNTER — Encounter (HOSPITAL_COMMUNITY): Payer: Self-pay | Admitting: Registered Nurse

## 2016-05-15 ENCOUNTER — Observation Stay (HOSPITAL_COMMUNITY): Payer: Medicare HMO

## 2016-05-15 ENCOUNTER — Encounter (HOSPITAL_COMMUNITY)
Admission: EM | Disposition: A | Discharge status: Home / Self Care | Payer: Self-pay | Source: Home / Self Care | Attending: Emergency Medicine

## 2016-05-15 DIAGNOSIS — G4733 Obstructive sleep apnea (adult) (pediatric): Secondary | ICD-10-CM | POA: Diagnosis not present

## 2016-05-15 DIAGNOSIS — Z466 Encounter for fitting and adjustment of urinary device: Secondary | ICD-10-CM | POA: Diagnosis not present

## 2016-05-15 DIAGNOSIS — R911 Solitary pulmonary nodule: Secondary | ICD-10-CM | POA: Diagnosis not present

## 2016-05-15 DIAGNOSIS — F329 Major depressive disorder, single episode, unspecified: Secondary | ICD-10-CM | POA: Diagnosis not present

## 2016-05-15 DIAGNOSIS — I119 Hypertensive heart disease without heart failure: Secondary | ICD-10-CM | POA: Diagnosis not present

## 2016-05-15 DIAGNOSIS — H4311 Vitreous hemorrhage, right eye: Secondary | ICD-10-CM | POA: Diagnosis not present

## 2016-05-15 DIAGNOSIS — R944 Abnormal results of kidney function studies: Secondary | ICD-10-CM | POA: Diagnosis not present

## 2016-05-15 DIAGNOSIS — F419 Anxiety disorder, unspecified: Secondary | ICD-10-CM | POA: Diagnosis not present

## 2016-05-15 DIAGNOSIS — N201 Calculus of ureter: Secondary | ICD-10-CM | POA: Diagnosis not present

## 2016-05-15 DIAGNOSIS — G2581 Restless legs syndrome: Secondary | ICD-10-CM | POA: Diagnosis not present

## 2016-05-15 DIAGNOSIS — N132 Hydronephrosis with renal and ureteral calculous obstruction: Secondary | ICD-10-CM | POA: Diagnosis not present

## 2016-05-15 DIAGNOSIS — N179 Acute kidney failure, unspecified: Secondary | ICD-10-CM | POA: Diagnosis not present

## 2016-05-15 DIAGNOSIS — E119 Type 2 diabetes mellitus without complications: Secondary | ICD-10-CM | POA: Diagnosis not present

## 2016-05-15 DIAGNOSIS — N135 Crossing vessel and stricture of ureter without hydronephrosis: Secondary | ICD-10-CM | POA: Diagnosis not present

## 2016-05-15 DIAGNOSIS — E7800 Pure hypercholesterolemia, unspecified: Secondary | ICD-10-CM | POA: Diagnosis not present

## 2016-05-15 HISTORY — PX: CYSTOSCOPY WITH STENT PLACEMENT: SHX5790

## 2016-05-15 LAB — BASIC METABOLIC PANEL
ANION GAP: 11 (ref 5–15)
BUN: 34 mg/dL — ABNORMAL HIGH (ref 6–20)
CALCIUM: 8.6 mg/dL — AB (ref 8.9–10.3)
CHLORIDE: 102 mmol/L (ref 101–111)
CO2: 25 mmol/L (ref 22–32)
Creatinine, Ser: 2.65 mg/dL — ABNORMAL HIGH (ref 0.61–1.24)
GFR calc non Af Amer: 23 mL/min — ABNORMAL LOW (ref >60)
GFR, EST AFRICAN AMERICAN: 27 mL/min — AB (ref >60)
GLUCOSE: 193 mg/dL — AB (ref 65–99)
Potassium: 4.7 mmol/L (ref 3.5–5.1)
Sodium: 138 mmol/L (ref 135–145)

## 2016-05-15 LAB — GLUCOSE, CAPILLARY
GLUCOSE-CAPILLARY: 171 mg/dL — AB (ref 65–99)
GLUCOSE-CAPILLARY: 169 mg/dL — AB (ref 65–99)
Glucose-Capillary: 160 mg/dL — ABNORMAL HIGH (ref 65–99)
Glucose-Capillary: 170 mg/dL — ABNORMAL HIGH (ref 65–99)
Glucose-Capillary: 224 mg/dL — ABNORMAL HIGH (ref 65–99)
Glucose-Capillary: 187 mg/dL — ABNORMAL HIGH (ref 65–99)

## 2016-05-15 LAB — CBC
HCT: 41.5 % (ref 39.0–52.0)
HEMOGLOBIN: 14.2 g/dL (ref 13.0–17.0)
MCH: 31.6 pg (ref 26.0–34.0)
MCHC: 34.2 g/dL (ref 30.0–36.0)
MCV: 92.2 fL (ref 78.0–100.0)
Platelets: 189 10*3/uL (ref 150–400)
RBC: 4.5 MIL/uL (ref 4.22–5.81)
RDW: 15 % (ref 11.5–15.5)
WBC: 11.5 10*3/uL — ABNORMAL HIGH (ref 4.0–10.5)

## 2016-05-15 LAB — SURGICAL PCR SCREEN
MRSA, PCR: NEGATIVE
Staphylococcus aureus: NEGATIVE

## 2016-05-15 SURGERY — Surgical Case
Anesthesia: *Unknown

## 2016-05-15 SURGERY — CYSTOSCOPY, WITH STENT INSERTION
Anesthesia: General | Laterality: Bilateral

## 2016-05-15 MED ORDER — FENTANYL CITRATE (PF) 100 MCG/2ML IJ SOLN
INTRAMUSCULAR | Status: DC | PRN
  Administered 2016-05-15 (×3): 50 ug via INTRAVENOUS

## 2016-05-15 MED ORDER — ONDANSETRON HCL 4 MG/2ML IJ SOLN
INTRAMUSCULAR | Status: DC | PRN
  Administered 2016-05-15: 4 mg via INTRAVENOUS

## 2016-05-15 MED ORDER — LIDOCAINE 2% (20 MG/ML) 5 ML SYRINGE
INTRAMUSCULAR | Status: DC | PRN
  Administered 2016-05-15: 100 mg via INTRAVENOUS
  Administered 2016-05-15: 25 mg via INTRAVENOUS

## 2016-05-15 MED ORDER — HYDROMORPHONE HCL 1 MG/ML IJ SOLN
1.0000 mg | INTRAMUSCULAR | Status: DC | PRN
  Administered 2016-05-15: 1 mg via INTRAVENOUS
  Filled 2016-05-15: qty 1

## 2016-05-15 MED ORDER — PROPOFOL 10 MG/ML IV BOLUS
INTRAVENOUS | Status: AC
  Filled 2016-05-15: qty 20

## 2016-05-15 MED ORDER — BELLADONNA ALKALOIDS-OPIUM 16.2-60 MG RE SUPP: 1.0000 | Freq: Three times a day (TID) | RECTAL | Status: DC | PRN

## 2016-05-15 MED ORDER — STERILE WATER FOR IRRIGATION IR SOLN
Status: DC | PRN
  Administered 2016-05-15: 3000 mL

## 2016-05-15 MED ORDER — CEFTRIAXONE SODIUM 1 G IJ SOLR
INTRAMUSCULAR | Status: AC
  Filled 2016-05-15: qty 10

## 2016-05-15 MED ORDER — ONDANSETRON HCL 4 MG/2ML IJ SOLN
INTRAMUSCULAR | Status: AC
  Filled 2016-05-15: qty 2

## 2016-05-15 MED ORDER — ROPINIROLE HCL 0.25 MG PO TABS
15.0000 mg | ORAL_TABLET | Freq: Every day | ORAL | Status: DC
  Filled 2016-05-15: qty 60

## 2016-05-15 MED ORDER — ALBUTEROL SULFATE HFA 108 (90 BASE) MCG/ACT IN AERS
INHALATION_SPRAY | RESPIRATORY_TRACT | Status: DC | PRN
  Administered 2016-05-15: 4 via RESPIRATORY_TRACT

## 2016-05-15 MED ORDER — LIDOCAINE HCL 2 % EX GEL
CUTANEOUS | Status: AC
  Filled 2016-05-15: qty 5

## 2016-05-15 MED ORDER — MIDAZOLAM HCL 2 MG/2ML IJ SOLN
INTRAMUSCULAR | Status: AC
  Filled 2016-05-15: qty 2

## 2016-05-15 MED ORDER — IOHEXOL 300 MG/ML  SOLN
INTRAMUSCULAR | Status: DC | PRN
  Administered 2016-05-15: 50 mL

## 2016-05-15 MED ORDER — FENTANYL CITRATE (PF) 100 MCG/2ML IJ SOLN: 25.0000 ug | INTRAMUSCULAR | Status: DC | PRN

## 2016-05-15 MED ORDER — LIDOCAINE HCL 2 % EX GEL
CUTANEOUS | Status: DC | PRN
  Administered 2016-05-15: 1 via URETHRAL

## 2016-05-15 MED ORDER — ALBUTEROL SULFATE HFA 108 (90 BASE) MCG/ACT IN AERS
INHALATION_SPRAY | RESPIRATORY_TRACT | Status: AC
  Filled 2016-05-15: qty 6.7

## 2016-05-15 MED ORDER — PROPOFOL 10 MG/ML IV BOLUS
INTRAVENOUS | Status: DC | PRN
  Administered 2016-05-15: 250 mg via INTRAVENOUS
  Administered 2016-05-15: 100 mg via INTRAVENOUS

## 2016-05-15 MED ORDER — FENTANYL CITRATE (PF) 250 MCG/5ML IJ SOLN
INTRAMUSCULAR | Status: AC
  Filled 2016-05-15: qty 5

## 2016-05-15 MED ORDER — ALBUTEROL SULFATE (2.5 MG/3ML) 0.083% IN NEBU
INHALATION_SOLUTION | RESPIRATORY_TRACT | Status: AC
  Administered 2016-05-15: 2.5 mg
  Filled 2016-05-15: qty 3

## 2016-05-15 MED ORDER — SULFAMETHOXAZOLE-TRIMETHOPRIM 800-160 MG PO TABS
1.0000 | ORAL_TABLET | Freq: Two times a day (BID) | ORAL | Status: DC
  Administered 2016-05-15: 1 via ORAL
  Filled 2016-05-15: qty 1

## 2016-05-15 MED ORDER — ALBUTEROL SULFATE (2.5 MG/3ML) 0.083% IN NEBU: 2.5000 mg | INHALATION_SOLUTION | Freq: Once | RESPIRATORY_TRACT | Status: DC

## 2016-05-15 MED ORDER — SODIUM CHLORIDE 0.9 % IV SOLN
INTRAVENOUS | Status: DC | PRN
  Administered 2016-05-15: 04:00:00 via INTRAVENOUS

## 2016-05-15 SURGICAL SUPPLY — 12 items
BAG URO CATCHER STRL LF (MISCELLANEOUS) ×3 IMPLANT
CATH INTERMIT  6FR 70CM (CATHETERS) ×3 IMPLANT
CLOTH BEACON ORANGE TIMEOUT ST (SAFETY) ×3 IMPLANT
COVER SURGICAL LIGHT HANDLE (MISCELLANEOUS) ×3 IMPLANT
GLOVE BIOGEL M STRL SZ7.5 (GLOVE) ×3 IMPLANT
GOWN STRL REUS W/TWL LRG LVL3 (GOWN DISPOSABLE) ×6 IMPLANT
GUIDEWIRE STR DUAL SENSOR (WIRE) ×3 IMPLANT
MANIFOLD NEPTUNE II (INSTRUMENTS) ×3 IMPLANT
PACK CYSTO (CUSTOM PROCEDURE TRAY) ×3 IMPLANT
STENT URET 6FRX24 CONTOUR (STENTS) ×4 IMPLANT
TUBING CONNECTING 10 (TUBING) IMPLANT
TUBING CONNECTING 10' (TUBING)

## 2016-05-15 NOTE — Anesthesia Postprocedure Evaluation (Signed)
Anesthesia Post Note  Patient: Lucas Clark  Procedure(s) Performed: Procedure(s) (LRB): CYSTOSCOPY Bilateral retrograde pyelogram WITH bilateral STENT PLACEMENT (Bilateral)  Patient location during evaluation: PACU Anesthesia Type: General Level of consciousness: awake Pain management: pain level controlled Respiratory status: spontaneous breathing Cardiovascular status: stable Anesthetic complications: no       Last Vitals:  Vitals:   05/15/16 0530 05/15/16 0545  BP: (!) 135/50 (!) 148/78  Pulse: 99 98  Resp: 20 14  Temp: 36.9 C     Last Pain:  Vitals:   05/15/16 0600  TempSrc:   PainSc: (P) 0-No pain                 Carel Carrier

## 2016-05-15 NOTE — Op Note (Signed)
Preoperative diagnosis:  1. Bilateral obstructing ureteral stones   Postoperative diagnosis:  1. Bilateral obstructing ureteral stones   Procedure: 1. Cystoscopy 2. Bilateral retrograde pyelograms with interpretation 3. Bilateral stent placement  Surgeon: Crist Fat, MD  Anesthesia: General  Complications: None  Intraoperative findings:  #1: The bladder was indurated and there were some small stones that appeared to have passed within the patient's bladder. His bladder has small capacity. The ureters were orthotopic.  #2: The left retrograde pyelogram was done using 10 mL of Omnipaque contrast.  There were several filling defects in the distal ureter as well as within the left kidney. There was hydronephrosis proximally.  #3: The right retrograde pyelogram was performed using a 5 [place] open-ended ureteral catheter and 10 mL Omnipaque contrast. This demonstrated a normal caliber ureter up to the proximal ureter where there was a large filling defect consistent with the patient's known UPJ stone and associated proximal hydronephrosis.  #4: 24 cm times 6 French double-J ureteral stents were left in the patient's ureters bilaterally.  EBL: Minimal  Specimens: None  Indication: Lucas Clark is a 66 y.o. patient with bilateral obstructing stones and oliguria with associated elevated creatinine..  After reviewing the management options for treatment, he elected to proceed with the above surgical procedure(s). We have discussed the potential benefits and risks of the procedure, side effects of the proposed treatment, the likelihood of the patient achieving the goals of the procedure, and any potential problems that might occur during the procedure or recuperation. Informed consent has been obtained.  Description of procedure:  The patient was taken to the operating room and general anesthesia was induced.  The patient was placed in the dorsal lithotomy position, prepped and  draped in the usual sterile fashion, and preoperative antibiotics were administered. A preoperative time-out was performed.   21 French 30 cystoscope was gently passed to the patient's urethra and into the bladder under visual guidance. A 3 and 60 cystoscopic evaluation was performed with the above findings. A 5 French open ended ureteral catheter was then used to cannulate the patient's left ureteral orifice and a retrograde pyelogram was performed with the above findings. A 0.038 sensor wire was then advanced through the open-ended catheter and into the left ureter using fluoroscopy. The open-ended catheter was then removed and exchanged for 24 cm times 6 French double-J ureteral stent. This was advanced over the wire using the Seldinger technique until the stent was noted to be up into the patient's left renal pelvis. The wire was then slowly backed out noting a nice curl in the left renal pelvis. The stent was then advanced the bladder neck and the wire was completely removed. There is a nice curl in the patient's bladder as well.  The 5 [place] open-ended catheter was then used to cannulate the patient's right ureteral orifice and retrograde pyelogram was performed with the above findings. A 0.38 sensor wire was then advanced through the open-ended catheter exchanged out. A 24 cm times 6 French double-J ureteral stent was then advanced over the wire and into the renal pelvis under fluoroscopic guidance. A nice curl was noted in the patient's right renal pelvis and the wire was slowly backed out with the stent at the bladder neck. Stent was completely removed and nice curl noted in the patient's bladder as well.  The bladder was then emptied. Lidocaine jelly was then injected into the patient's urethra. He was subsequent x-ray returned to PACU in stable condition.  The patient will then be scheduled for follow-up ureteroscopy/PCNL for the treatment of stones.  Crist Fat, M.D.

## 2016-05-15 NOTE — Anesthesia Procedure Notes (Signed)
Procedure Name: Intubation Date/Time: 05/15/2016 4:37 AM Performed by: Illene Silver Pre-anesthesia Checklist: Patient identified, Emergency Drugs available, Suction available and Patient being monitored Patient Re-evaluated:Patient Re-evaluated prior to inductionOxygen Delivery Method: Circle system utilized Preoxygenation: Pre-oxygenation with 100% oxygen Intubation Type: IV induction Ventilation: Mask ventilation without difficulty Laryngoscope Size: Glidescope and 4 Grade View: Grade I Tube type: Oral Tube size: 8.0 mm Number of attempts: 1 Airway Equipment and Method: Stylet,  Oral airway and Video-laryngoscopy Placement Confirmation: ETT inserted through vocal cords under direct vision,  positive ETCO2 and breath sounds checked- equal and bilateral Secured at: 23 cm Tube secured with: Tape Dental Injury: Teeth and Oropharynx as per pre-operative assessment  Difficulty Due To: Difficulty was anticipated Comments: Elective glidescope, good visualization with blade 4 of glidescope.

## 2016-05-15 NOTE — Transfer of Care (Signed)
Immediate Anesthesia Transfer of Care Note  Patient: Lucas Clark  Procedure(s) Performed: Procedure(s): CYSTOSCOPY Bilateral retrograde pyelogram WITH bilateral STENT PLACEMENT (Bilateral)  Patient Location: PACU  Anesthesia Type:General  Level of Consciousness: awake, alert , oriented and patient cooperative  Airway & Oxygen Therapy: Patient Spontanous Breathing and Patient connected to face mask oxygen  Post-op Assessment: Report given to RN, Post -op Vital signs reviewed and stable and Patient moving all extremities X 4  Post vital signs: stable  Last Vitals:  Vitals:   05/14/16 2236 05/15/16 0530  BP: (!) 174/88   Pulse: 100   Resp: (!) 22   Temp: 37.2 C 36.9 C    Last Pain:  Vitals:   05/15/16 0530  TempSrc:   PainSc: 0-No pain      Patients Stated Pain Goal: 0 (05/14/16 2352)  Complications: No apparent anesthesia complications

## 2016-05-15 NOTE — H&P (Signed)
History of present illness: 66 year old male who presented to the emergency department yesterday afternoon with progressive bilateral flank and pelvic pain. The patient's symptoms actually began several days prior to that. They progressed to the point where he needed pain medicine. He was unable to void since one day prior. He did have some dysuria and the feeling of having to urinate. He denies any fevers. He denies any nausea or vomiting. His pain is 8/10 currently. In the emergency department the patient underwent a CT scan demonstrating bilateral obstructing ureteral stones. The patient's urinalysis demonstrated nitrite positive urine with positive white cells and red cells. He was given 1 g of ceftriaxone and a urine culture was sent. His pain is been managed with IV Dilaudid. He has remained afebrile. He was transferred to [facility] from the [facility] emergency department for definitive management.  The patient has no history of kidney stones in the past.  Review of systems: A 12 point comprehensive review of systems was obtained and is negative unless otherwise stated in the history of present illness.  Patient Active Problem List   Diagnosis Date Noted  . Hydronephrosis 05/14/2016  . Nephrolithiasis 05/14/2016  . Vitreous hemorrhage of right eye (HCC) 05/23/2011  . RESTLESS LEGS SYNDROME 05/31/2007  . OBSTRUCTIVE SLEEP APNEA 02/15/2007    No current facility-administered medications on file prior to encounter.    Current Outpatient Prescriptions on File Prior to Encounter  Medication Sig Dispense Refill  . amLODipine (NORVASC) 5 MG tablet Take 5 mg by mouth daily.    . clonazePAM (KLONOPIN) 1 MG tablet Take 1 mg by mouth 3 (three) times daily as needed. For anxiety    . metFORMIN (GLUCOPHAGE) 500 MG tablet Take 500 mg by mouth 2 (two) times daily with a meal.    . pravastatin (PRAVACHOL) 40 MG tablet Take 40 mg by mouth daily.    . ropinirole (REQUIP) 5 MG tablet Take 15  mg by mouth at bedtime.    . sertraline (ZOLOFT) 50 MG tablet Take 50 mg by mouth daily.    Marland Kitchen tiotropium (SPIRIVA) 18 MCG inhalation capsule Place 18 mcg into inhaler and inhale daily.      Past Medical History:  Diagnosis Date  . Anxiety   . Arthritis    "little bit"  . Asthma    "a touch"  . Blood transfusion ~ 1964  . Bronchitis    "a touch"  . Depression   . High cholesterol   . Hypertension   . Restless legs   . Sleep apnea    last sleep study 2005. does not use cpap  . Stroke [facility]) ~2009   denies residual  . Type II diabetes mellitus (HCC)     Past Surgical History:  Procedure Laterality Date  . EYE SURGERY  25 years ago   R cataract excision, eye lid surgery on L eye as child  . GAS/FLUID EXCHANGE  05/23/2011   Procedure: GAS/FLUID EXCHANGE;  Surgeon: Sherrie George, MD;  Location: [facility] OR;  Service: Ophthalmology;  Laterality: Right;  . PARS PLANA VITRECTOMY  05/23/11   gas fluid exchange; membrane peel; right eye  . PARS PLANA VITRECTOMY  05/23/2011   Procedure: PARS PLANA VITRECTOMY WITH 25 GAUGE;  Surgeon: Sherrie George, MD;  Location: [facility] OR;  Service: Ophthalmology;  Laterality: Right;  Endolaser  . TOTAL HIP ARTHROPLASTY  08/10/2005   right  . TOTAL HIP ARTHROPLASTY  11/2005   left    Social History  Substance Use Topics  . Smoking status: Former Smoker    Packs/day: 4.00    Years: 20.00    Types: Cigarettes    Quit date: 10/01/1991  . Smokeless tobacco: Never Used  . Alcohol use Yes     Comment: occasional    History reviewed. No pertinent family history.  PE: Vitals:   05/14/16 1303 05/14/16 1941 05/14/16 2105 05/14/16 2236  BP: (!) 177/92 (!) 158/68 (!) 159/85 (!) 174/88  Pulse: 99 (!) 103 (!) 102 100  Resp: (!) 24 14 (!) 22 (!) 22  Temp: 98.9 F (37.2 C)   98.9 F (37.2 C)  TempSrc: Oral   Oral  SpO2: 97% 92% 95% 94%  Weight: 131.5 kg (290 lb)   133.1 kg (293 lb 6.9 oz)  Height:  (1.626 m)    (1.626 m)   Patient appears to be  in no acute distress  patient is alert and oriented x3 Atraumatic normocephalic head No cervical or supraclavicular lymphadenopathy appreciated No increased work of breathing, no audible wheezes/rhonchi Regular sinus rhythm/rate Abdomen is soft, nontender, nondistended, no CVA or suprapubic tenderness Lower extremities are symmetric without appreciable edema Grossly neurologically intact No identifiable skin lesions   Recent Labs  05/14/16 1559  WBC 13.3*  HGB 14.9  HCT 43.2    Recent Labs  05/14/16 1559  NA 133*  K 5.2*  CL 98*  CO2 24  GLUCOSE 157*  BUN 28*  CREATININE 2.31*  CALCIUM 9.1   No results for input(s): LABPT, INR in the last 72 hours. No results for input(s): LABURIN in the last 72 hours. Results for orders placed or performed during the hospital encounter of 05/14/16  Surgical pcr screen     Status: None   Collection Time: 05/15/16  1:52 AM  Result Value Ref Range Status   MRSA, PCR NEGATIVE NEGATIVE Final   Staphylococcus aureus NEGATIVE NEGATIVE Final    Comment:        The Xpert SA Assay (FDA approved for NASAL specimens in patients over 57 years of age), is one component of a comprehensive surveillance program.  Test performance has been validated by [facility] for patients greater than or equal to 40 year old. It is not intended to diagnose infection nor to guide or monitor treatment.     Imaging: I have independently reviewed the patient's CT scan which demonstrates a 10 x 6 mm stone at the right UPJ. In addition, the patient has a 4 mm partially obstructing stone at the left UPJ with small stones within the mid left ureter. He also has a 1.8 times 1.8 centimeter stone in the left renal pelvis.  An incidental finding was noted 1.5 centimeter lesion in the left lower lobe of the lung.  Imp: The patient has bilateral obstructing stones. He is oliguric with elevated creatinine. In addition, the patient has nitrite positive urine  concerning for infection.  Recommendations: Our plan is to take the patient to the operating room urgently for bilateral ureteral stent placement. I discussed this procedure with the patient in significant detail. I discussed with him the fact that following this operation he will need more definitive management of his stones moving forward which we will do once the patient's infection has been adequately treated. The patient has been admitted for observation, we'll likely be able to be discharged once the sensor placed and he is proven to be afebrile.   Berniece Salines W

## 2016-05-15 NOTE — Anesthesia Preprocedure Evaluation (Addendum)
Anesthesia Evaluation  Patient identified by MRN, date of birth, ID band Patient awake    Reviewed: Allergy & Precautions, NPO status , Patient's Chart, lab work & pertinent test results  Airway Mallampati: II  TM Distance: >3 FB     Dental   Pulmonary asthma , sleep apnea , former smoker,    breath sounds clear to auscultation       Cardiovascular hypertension,  Rhythm:Regular Rate:Normal     Neuro/Psych PSYCHIATRIC DISORDERS Anxiety Depression CVA    GI/Hepatic Neg liver ROS, History noted. CG   Endo/Other  diabetes  Renal/GU Renal disease     Musculoskeletal  (+) Arthritis ,   Abdominal   Peds  Hematology   Anesthesia Other Findings   Reproductive/Obstetrics                            Anesthesia Physical Anesthesia Plan  ASA: III  Anesthesia Plan: General   Post-op Pain Management:    Induction: Intravenous  Airway Management Planned: LMA  Additional Equipment:   Intra-op Plan:   Post-operative Plan: Extubation in OR  Informed Consent: I have reviewed the patients History and Physical, chart, labs and discussed the procedure including the risks, benefits and alternatives for the proposed anesthesia with the patient or authorized representative who has indicated his/her understanding and acceptance.   Dental advisory given  Plan Discussed with: CRNA and Anesthesiologist  Anesthesia Plan Comments:         Anesthesia Quick Evaluation

## 2016-05-15 NOTE — Progress Notes (Signed)
This PM patient feeling much better.  Tolerating regular diet without nausea.  Pain significantly better.  Having urinary urgency and incontinence.   Transition to Bactrim. Repeat labs in AM. Likely discharge tomorrow.

## 2016-05-16 ENCOUNTER — Other Ambulatory Visit: Payer: Self-pay | Admitting: Urology

## 2016-05-16 ENCOUNTER — Encounter (HOSPITAL_COMMUNITY): Payer: Self-pay | Admitting: Urology

## 2016-05-16 DIAGNOSIS — N135 Crossing vessel and stricture of ureter without hydronephrosis: Secondary | ICD-10-CM | POA: Diagnosis not present

## 2016-05-16 DIAGNOSIS — N179 Acute kidney failure, unspecified: Secondary | ICD-10-CM | POA: Diagnosis not present

## 2016-05-16 DIAGNOSIS — R944 Abnormal results of kidney function studies: Secondary | ICD-10-CM | POA: Diagnosis not present

## 2016-05-16 LAB — CBC
HCT: 39.9 % (ref 39.0–52.0)
HEMOGLOBIN: 13.1 g/dL (ref 13.0–17.0)
MCH: 31.3 pg (ref 26.0–34.0)
MCHC: 32.8 g/dL (ref 30.0–36.0)
MCV: 95.5 fL (ref 78.0–100.0)
Platelets: 174 10*3/uL (ref 150–400)
RBC: 4.18 MIL/uL — AB (ref 4.22–5.81)
RDW: 15.3 % (ref 11.5–15.5)
WBC: 9.9 10*3/uL (ref 4.0–10.5)

## 2016-05-16 LAB — BASIC METABOLIC PANEL
Anion gap: 10 (ref 5–15)
BUN: 32 mg/dL — ABNORMAL HIGH (ref 6–20)
CHLORIDE: 103 mmol/L (ref 101–111)
CO2: 26 mmol/L (ref 22–32)
CREATININE: 1.54 mg/dL — AB (ref 0.61–1.24)
Calcium: 8.4 mg/dL — ABNORMAL LOW (ref 8.9–10.3)
GFR calc non Af Amer: 45 mL/min — ABNORMAL LOW (ref >60)
GFR, EST AFRICAN AMERICAN: 52 mL/min — AB (ref >60)
GLUCOSE: 161 mg/dL — AB (ref 65–99)
Potassium: 3.9 mmol/L (ref 3.5–5.1)
Sodium: 139 mmol/L (ref 135–145)

## 2016-05-16 LAB — GLUCOSE, CAPILLARY
GLUCOSE-CAPILLARY: 138 mg/dL — AB (ref 65–99)
GLUCOSE-CAPILLARY: 149 mg/dL — AB (ref 65–99)
GLUCOSE-CAPILLARY: 200 mg/dL — AB (ref 65–99)
Glucose-Capillary: 157 mg/dL — ABNORMAL HIGH (ref 65–99)
Glucose-Capillary: 232 mg/dL — ABNORMAL HIGH (ref 65–99)

## 2016-05-16 LAB — URINE CULTURE: Culture: NO GROWTH

## 2016-05-16 MED ORDER — TROSPIUM CHLORIDE ER 60 MG PO CP24: 60.0000 mg | ORAL_CAPSULE | Freq: Every day | ORAL | 0 refills | Status: DC

## 2016-05-16 MED ORDER — CIPROFLOXACIN HCL 500 MG PO TABS
500.0000 mg | ORAL_TABLET | Freq: Two times a day (BID) | ORAL | Status: DC
  Administered 2016-05-16: 500 mg via ORAL
  Filled 2016-05-16: qty 1

## 2016-05-16 MED ORDER — PHENAZOPYRIDINE HCL 200 MG PO TABS: 200.0000 mg | ORAL_TABLET | Freq: Three times a day (TID) | ORAL | 0 refills | Status: DC | PRN

## 2016-05-16 MED ORDER — CIPROFLOXACIN HCL 500 MG PO TABS: 500.0000 mg | ORAL_TABLET | Freq: Two times a day (BID) | ORAL | 0 refills | Status: AC

## 2016-05-16 NOTE — Discharge Instructions (Signed)
After you are treated for the ureter obstructions, he will need to follow-up with your primary care doctor regarding the CT results today, which indicated that you have stones in the gallbladder.   This can be looked into, within the next 1-2 weeks.  DISCHARGE INSTRUCTIONS FOR KIDNEY STONE/URETERAL STENT   MEDICATIONS:  1.  Resume all your other meds from home - except do not take any extra narcotic pain meds that you may have at home.  2. Pyridium is to help with the burning/stinging when you urinate. 3. Trospium is for urinary urgency as needed. 4. Take Cipro (antibiotic) for 7 days.  ACTIVITY:  1. No strenuous activity x 1week  2. No driving while on narcotic pain medications  3. Drink plenty of water  4. Continue to walk at home - you can still get blood clots when you are at home, so keep active, but don't over do it.  5. May return to work/school tomorrow or when you feel ready   BATHING:  1. You can shower and we recommend daily showers    SIGNS/SYMPTOMS TO CALL:  Please call us if you have a fever greater than 101.5, uncontrolled nausea/vomiting, uncontrolled pain, dizziness, unable to urinate, bloody urine, chest pain, shortness of breath, leg swelling, leg pain, redness around wound, drainage from wound, or any other concerns or questions.   You can reach [place] at [phone].   FOLLOW-UP:  1. You will be contacted with follow-up surgery in the next few days.

## 2016-05-16 NOTE — Progress Notes (Signed)
Urology Inpatient Progress Report  Ureter obstruction [N13.5] Calculus of gallbladder without cholecystitis without obstruction [K80.20] AKI (acute kidney injury) (HCC) [N17.9] Right ureteral stone [N20.1] Left ureteral stone [N20.1] Lung nodule < 6cm on CT [R91.1]  Procedure(s): CYSTOSCOPY Bilateral retrograde pyelogram WITH bilateral STENT PLACEMENT  1 Day Post-Op   Intv/Subj: Headache which he attributes to Bactrim - better now Still with urge incontinence, condom cath placed. No complaints of pain AF VSS  Active Problems:   Hydronephrosis   Nephrolithiasis  Current Facility-Administered Medications  Medication Dose Route Frequency Provider Last Rate Last Dose  . amLODipine (NORVASC) tablet 5 mg  5 mg Oral Daily Crist Fat, MD   5 mg at 05/15/16 1126  . ciprofloxacin (CIPRO) tablet 500 mg  500 mg Oral BID Crist Fat, MD      . clonazePAM [facility]) tablet 1 mg  1 mg Oral TID PRN Crist Fat, MD   1 mg at 05/15/16 2313  . HYDROmorphone (DILAUDID) injection 1 mg  1 mg Intravenous Q1H PRN Crist Fat, MD   1 mg at 05/15/16 2314  . insulin aspart (novoLOG) injection 0-20 Units  0-20 Units Subcutaneous Q4H Crist Fat, MD   3 Units at 05/16/16 [phone]  . ondansetron (ZOFRAN) tablet 4 mg  4 mg Oral Q8H PRN Crist Fat, MD      . opium-belladonna (B&O SUPPRETTES) 16.2-60 MG suppository 1 suppository  1 suppository Rectal Q8H PRN Crist Fat, MD      . tiotropium [facility]) inhalation capsule 18 mcg  18 mcg Inhalation Daily Crist Fat, MD   18 mcg at 05/15/16 1119     Objective: Vital: Vitals:   05/15/16 1125 05/15/16 1523 05/15/16 2145 05/16/16 0530  BP:  (!) 120/94 136/72 140/68  Pulse:  97 96 87  Resp:  Temp:  99.5 F (37.5 C) 99.1 F (37.3 C) 99.3 F (37.4 C)  TempSrc:  Oral Oral Oral  SpO2: 98% 95% 95% 95%  Weight:      Height:       I/Os: I/O last 3 completed shifts: In: 740 [P.O.:60; I.V.:600;  Other:30; IV Piggyback:50] Out: 850 [Urine:845; Blood:5]  Physical Exam:  General: Patient is in no apparent distress Lungs: Normal respiratory effort, chest expands symmetrically. GI: The abdomen is soft and nontender without mass. Foley: straw colored  Ext: lower extremities symmetric  Lab Results:  Recent Labs  05/14/16 1559 05/15/16 0745 05/16/16 0402  WBC 13.3* 11.5* 9.9  HGB 14.9 14.2 13.1  HCT 43.2 41.5 39.9    Recent Labs  05/14/16 1559 05/15/16 0745 05/16/16 0402  NA 133* 138 139  K 5.2* 4.7 3.9  CL 98* 102 103  CO2 GLUCOSE 157* 193* 161*  BUN 28* 34* 32*  CREATININE 2.31* 2.65* 1.54*  CALCIUM 9.1 8.6* 8.4*   No results for input(s): LABPT, INR in the last 72 hours. No results for input(s): LABURIN in the last 72 hours. Results for orders placed or performed during the hospital encounter of 05/14/16  Surgical pcr screen     Status: None   Collection Time: 05/15/16  1:52 AM  Result Value Ref Range Status   MRSA, PCR NEGATIVE NEGATIVE Final   Staphylococcus aureus NEGATIVE NEGATIVE Final    Comment:        The Xpert SA Assay (FDA approved for NASAL specimens in patients over 14 years of age), is one component of a comprehensive surveillance  program.  Test performance has been validated by [facility] for patients greater than or equal to 67 year old. It is not intended to diagnose infection nor to guide or monitor treatment.     Studies/Results: Ct Chest Wo Contrast  Result Date: 05/15/2016 CLINICAL DATA:  Admitted for urinary issues. Left lower lobe pulmonary nodule. EXAM: CT CHEST WITHOUT CONTRAST TECHNIQUE: Multidetector CT imaging of the chest was performed following the standard protocol without IV contrast. COMPARISON:  Abdominal CT of 1 day prior.  Chest CT 04/09/2010. FINDINGS: Cardiovascular: Aortic and branch vessel atherosclerosis. Mild cardiomegaly with lipomatous hypertrophy of the interatrial septum. Lad and probable left  main coronary artery atherosclerosis Mediastinum/Nodes: right paratracheal node measures 1.3 cm on image 47/series 2, maintains its fatty hilum. Minimally enlarged from 1.2 cm on 04/08/2010. Hilar regions poorly evaluated without intravenous contrast. The esophagus is mildly dilated with fluid within. Example image 90/series 2. Lungs/Pleura: No pleural fluid. Mild centrilobular emphysema. Right lower lobe calcified granuloma of 3 mm on image 88/series 7. well-circumscribed left lower lobe pulmonary nodule measures 13 x 13 mm on image 97/series 7. Probable fat density within. This is similar to minimally enlarged compared to 13 x 12 mm on 04/08/2010. This consistent with a benign etiology, most likely a hamartoma. Upper Abdomen: Mild hepatic steatosis. Central left and caudate lobe cyst. Normal imaged portions of the spleen, stomach, pancreas, adrenal glands, right kidney. Incompletely imaged left renal lesion is likely a cyst. Musculoskeletal: Remote anterior right rib trauma. IMPRESSION: 1. Similar to minimal enlargement of a left lower lobe pulmonary nodule since 04/09/2010. This is consistent with a benign etiology, most likely a hamartoma. 2. Esophageal dilatation with fluid within. This suggests dysmotility or gastroesophageal reflux. Electronically Signed   By: Jeronimo Greaves M.D.   On: 05/15/2016 10:38   Dg Retrograde Pyelogram  Result Date: 05/15/2016 CLINICAL DATA:  Ureteral calculi EXAM: INTRAOPERATIVE BILATERAL RETROGRADE UROGRAPHY TECHNIQUE: Images were obtained with the C-arm fluoroscopic device intraoperatively and submitted for interpretation post-operatively. Please see the procedural report for the amount of contrast and the fluoroscopy time utilized. COMPARISON:  None. FINDINGS: The left ureteral stent has been placed. There is an oval filling defect in the proximal right ureter. A right ureteral stent was placed. IMPRESSION: See above. Electronically Signed   By: Jolaine Click M.D.   On:  05/15/2016 07:43   Ct Renal Stone Study  Result Date: 05/14/2016 CLINICAL DATA:  Abdominal pain, unable to urinate, hematuria EXAM: CT ABDOMEN AND PELVIS WITHOUT CONTRAST TECHNIQUE: Multidetector CT imaging of the abdomen and pelvis was performed following the standard protocol without IV contrast. COMPARISON:  04/08/2010 FINDINGS: Lower chest: Lung bases shows a nodular lesion in left lower lobe posteriorly measures 1.5 by 1.3 cm. Malignancy cannot be excluded. Further evaluation with CT scan of the chest and PET scan is recommended. Hepatobiliary: Unenhanced liver shows no biliary ductal dilatation. Probable cyst is noted in right hepatic lobe medially measures 1.9 cm. Calcified gallstones are noted in gallbladder neck region the largest measures 8 mm. Pancreas: Unenhanced pancreas is atrophic without focal abnormality. Spleen: Unenhanced spleen without focal abnormality. Adrenals/Urinary Tract: No adrenal gland mass. There is mild to moderate right hydronephrosis. Right perinephric stranding is noted. Axial image 46 and coronal image 79 there is calcified obstructive calculus in right UPJ/ proximal right ureter measures 10.4 m length by 6 mm transverse diameter. There is mild left hydronephrosis and left hydroureter. The left perinephric and periureteral stranding is noted. There is a coarse calcified  calculus in lower pole of the left kidney coronal image 81 measures 1.3 cm. Larger nonobstructive calcified in left renal pelvis measures 1.8 by 1.8 cm. There is a tiny nonobstructive calculus in left UPJ measures 4 mm. Axial image 59 there is a calcified partially obstructive calculus in mid left ureter at the level of L5 vertebral body measures about 5 mm. Multiple same size calculi are noted along the left ureter be low this calculus please see sagittal image 91. At least 4 or 5 obstructive calculi are noted along left ureter for about 2 cm length. Bilateral distal ureter is small caliber. The urinary bladder  is empty. There is a Foley catheter within decompressed urinary bladder. Stomach/Bowel: No small bowel obstruction. No thickened or dilated small bowel loops. Moderate stool noted within right colon and cecum. Terminal ileum is unremarkable. Normal appendix partially visualized in axial image 62 some colonic stool noted within transverse colon. No evidence of distal colitis or diverticulitis. Some colonic gas noted mid sigmoid colon. There is redundant sigmoid colon. Few diverticula are noted sigmoid colon without evidence of acute diverticulitis. Vascular/Lymphatic: Atherosclerotic calcifications of abdominal aorta and iliac arteries are noted. No aortic aneurysm. No retroperitoneal or mesenteric adenopathy. Reproductive: Small prostate gland calcifications are noted. Seminal vesicles are unremarkable. Other: There are extensive metallic artifacts from bilateral hip prosthesis. Musculoskeletal: Sagittal images of the spine shows osteopenia and degenerative changes thoracolumbar spine. IMPRESSION: 1. There is moderate right hydronephrosis. There is 10 x 6 mm calcified obstructive calculus in right UPJ /proximal right ureter. Right perinephric stranding is noted. 2. Significant left nephrolithiasis. Mild left hydronephrosis and left hydroureter. Larger nonobstructive calculus in left renal pelvis measures 1.8 x 1.8 cm. There is 4 mm partially obstructive calculus in left UPJ. Multiple obstructive calculi string like disposition are noted in mid left ureter please see coronal image 81 the largest measures 5 mm. Bilateral distal ureter is small caliber. Decompressed urinary bladder with a Foley catheter. 3. There is 1.5 cm nodular lesion in left lower lobe posteriorly. Malignancy cannot be excluded. Further correlation with CT scan of the chest and PET scan is recommended. 4. Normal appendix.  No pericecal inflammation. 5. No small bowel obstruction. 6. Mild degenerative changes thoracic spine. Bilateral hip  prosthesis. 7. Cholelithiasis is noted. These results were called by telephone at the time of interpretation on 05/14/2016 at 6:40 pm to Dr. Mancel Bale , who verbally acknowledged these results. Electronically Signed   By: Natasha Mead M.D.   On: 05/14/2016 18:40    Assessment: Procedure(s): CYSTOSCOPY Bilateral retrograde pyelogram WITH bilateral STENT PLACEMENT, 1 Day Post-Op  doing well.  Plan: Doing well - d/c home this PM Switch to cipro Will be scheduled for surgery for stone removal in 7-10 days   Berniece Salines, MD Urology 05/16/2016, 6:40 AM

## 2016-05-16 NOTE — Discharge Summary (Signed)
Date of admission: 05/14/2016  Date of discharge: 05/16/2016  Admission diagnosis: bilateral obstructing renal stones  Discharge diagnosis: same, acute renal failure   Secondary diagnoses:  Patient Active Problem List   Diagnosis Date Noted  . Hydronephrosis 05/14/2016  . Nephrolithiasis 05/14/2016  . Vitreous hemorrhage of right eye ([place]) 05/23/2011  . RESTLESS LEGS SYNDROME 05/31/2007  . OBSTRUCTIVE SLEEP APNEA 02/15/2007    Procedures performed: Procedure(s): CYSTOSCOPY Bilateral retrograde pyelogram WITH bilateral STENT PLACEMENT  History and Physical: For full details, please see admission history and physical. Briefly, Lucas Clark is a 66 y.o. year old patient with bilateral obstructing ureteral stones.   Hospital Course: Patient tolerated the procedure well.  He was then transferred to the floor after an uneventful PACU stay.  His hospital course was uncomplicated.  On POD#1 he had met discharge criteria: was eating a regular diet, was up and ambulating independently,  pain was well controlled, was voiding without a catheter, and was ready to for discharge.  CT scan was performed on his chest to evaluate the nodule seen on his A/P CT confirming likely benign etiology of this nodule given its slow growth when compared to a CT scan in 2012.  He was discharged with Cipro, Pyridium and Trospium.   Laboratory values:   Recent Labs  05/14/16 1559 05/15/16 0745 05/16/16 0402  WBC 13.3* 11.5* 9.9  HGB 14.9 14.2 13.1  HCT 43.2 41.5 39.9    Recent Labs  05/14/16 1559 05/15/16 0745 05/16/16 0402  NA 133* 138 139  K 5.2* 4.7 3.9  CL 98* 102 103  CO2 _0 GLUCOSE 157* 193* 161*  BUN 28* 34* 32*  CREATININE 2.31* 2.65* 1.54*  CALCIUM 9.1 8.6* 8.4*   No results for input(s): LABPT, INR in the last 72 hours. No results for input(s): LABURIN in the last 72 hours. Results for orders placed or performed during the hospital encounter of 05/14/16  Urine culture      Status: None   Collection Time: 05/14/16  6:03 PM  Result Value Ref Range Status   Specimen Description URINE, CATHETERIZED  Final   Special Requests NONE  Final   Culture   Final    NO GROWTH Performed at [facility], [address] [place], [place] [address]    Report Status 05/16/2016 FINAL  Final  Surgical pcr screen     Status: None   Collection Time: 05/15/16  1:52 AM  Result Value Ref Range Status   MRSA, PCR NEGATIVE NEGATIVE Final   Staphylococcus aureus NEGATIVE NEGATIVE Final    Comment:        The Xpert SA Assay (FDA approved for NASAL specimens in patients over 10 years of age), is one component of a comprehensive surveillance program.  Test performance has been validated by [facility] for patients greater than or equal to 32 year old. It is not intended to diagnose infection nor to guide or monitor treatment.     Disposition: Home  Discharge instruction: The patient was instructed to be ambulatory but told to refrain from heavy lifting, strenuous activity, or driving.   Discharge medications: Allergies as of 05/16/2016      Reactions   Vancomycin Anaphylaxis, Hives, Other (See Comments)   Organs shut down, possibly due to vancomycin.   Lisinopril Other (See Comments)   Possibly hives, but taking Vancomycin at same time   Rifampin    "was taking this along w/Vancomycin; not sure if reaction was  result of this RX too"      Medication List    TAKE these medications   amLODipine 5 MG tablet Commonly known as:  NORVASC Take 5 mg by mouth daily.   ciprofloxacin 500 MG tablet Commonly known as:  CIPRO Take 1 tablet (500 mg total) by mouth 2 (two) times daily.   clonazePAM 1 MG tablet Commonly known as:  KLONOPIN Take 1 mg by mouth 3 (three) times daily as needed. For anxiety   losartan 100 MG tablet Commonly known as:  COZAAR Take 100 mg by mouth daily.   metFORMIN 500 MG tablet Commonly known as:  GLUCOPHAGE Take 500 mg by mouth 2  (two) times daily with a meal.   phenazopyridine 200 MG tablet Commonly known as:  PYRIDIUM Take 1 tablet (200 mg total) by mouth 3 (three) times daily as needed for pain.   pioglitazone 15 MG tablet Commonly known as:  ACTOS Take 15 mg by mouth daily.   pravastatin 40 MG tablet Commonly known as:  PRAVACHOL Take 40 mg by mouth daily.   ropinirole 5 MG tablet Commonly known as:  REQUIP Take 15 mg by mouth at bedtime.   sertraline 50 MG tablet Commonly known as:  ZOLOFT Take 50 mg by mouth daily.   tiotropium 18 MCG inhalation capsule Commonly known as:  SPIRIVA Place 18 mcg into inhaler and inhale daily.   Trospium Chloride 60 MG Cp24 Take 1 capsule (60 mg total) by mouth daily.       Followup:  Follow-up Information    FAGAN,ROY, MD. Schedule an appointment as soon as possible for a visit in 1 week(s).   Specialty:  Internal Medicine Why:  To discuss the abnormal CT results regarding the left lung nodule, and the gallstones Contact information: [address] [place] [address] [phone]        Ardis Hughs, MD Follow up in 2 week(s).   Specialty:  Urology Why:  Surgery will be scheduled within 2 weeks. Contact information: [place] [address] [phone]

## 2016-05-16 NOTE — Progress Notes (Signed)
Patient to be discharged home as ordered. Daughter at bedside. Discharge paperwork explained to patient & daughter- both verbalized understanding. Printed prescriptions also given to patient. Patient denies pain & any discomfort. Able to ambulate with no complications. Condom cath still on patient per request-  Requests to wear home to prevent incontinence in car. Patient voiding adequate amounts of clear, yellow urine. Pt requests to eat supper, then will be assisted to daughter's vehicle by W/C.

## 2016-05-30 ENCOUNTER — Inpatient Hospital Stay (HOSPITAL_COMMUNITY)
Admission: RE | Admit: 2016-05-30 | Discharge: 2016-05-30 | Disposition: A | Discharge status: Home / Self Care | Payer: Commercial Managed Care - HMO | Source: Ambulatory Visit

## 2016-05-30 NOTE — Patient Instructions (Signed)
Lucas Clark  05/30/2016   Your procedure is scheduled on: 06-02-16  Report to [facility] Main  [facility]  elevators to 3rd floor to  Short Stay Center at  [phone].    Call this number if you have problems the morning of surgery [phone]   Remember: ONLY 1 PERSON MAY GO WITH YOU TO SHORT STAY TO GET  READY MORNING OF YOUR SURGERY.  Do not eat food or drink liquids :After Midnight.     Take these medicines the morning of surgery with A SIP OF WATER:   DO NOT TAKE ANY DIABETIC MEDICATIONS DAY OF YOUR SURGERY                               You may not have any metal on your body including hair pins and              piercings  Do not wear jewelry, make-up, lotions, powders or perfumes, deodorant             Men may shave face and neck.   Do not bring valuables to the hospital. [facility] IS NOT             RESPONSIBLE   FOR VALUABLES.  Contacts, dentures or bridgework may not be worn into surgery.     Please read over the following fact sheets you were given: _____________________________________________________________________   How to Manage Your Diabetes Before and After Surgery  Why is it important to control my blood sugar before and after surgery? . Improving blood sugar levels before and after surgery helps healing and can limit problems. . A way of improving blood sugar control is eating a healthy diet by: o  Eating less sugar and carbohydrates o  Increasing activity/exercise o  Talking with your doctor about reaching your blood sugar goals . High blood sugars (greater than 180 mg/dL) can raise your risk of infections and slow your recovery, so you will need to focus on controlling your diabetes during the weeks before surgery. . Make sure that the doctor who takes care of your diabetes knows about your planned surgery including the date and location.  How do I manage my blood sugar before surgery? . Check your blood sugar at least 4  times a day, starting 2 days before surgery, to make sure that the level is not too high or low. o Check your blood sugar the morning of your surgery when you wake up and every 2 hours until you get to the Short Stay unit. . If your blood sugar is less than 70 mg/dL, you will need to treat for low blood sugar: o Do not take insulin. o Treat a low blood sugar (less than 70 mg/dL) with  cup of clear juice (cranberry or apple), 4 glucose tablets, OR glucose gel. o Recheck blood sugar in 15 minutes after treatment (to make sure it is greater than 70 mg/dL). If your blood sugar is not greater than 70 mg/dL on recheck, call [phone] for further instructions. . Report your blood sugar to the short stay nurse when you get to Short Stay.  . If you are admitted to the hospital after surgery: o Your blood sugar will be checked by the staff and you will probably be given insulin after surgery (instead of oral diabetes medicines) to make  sure you have good blood sugar levels. o The goal for blood sugar control after surgery is 80-180 mg/dL.   WHAT DO I DO ABOUT MY DIABETES MEDICATION?  Marland Kitchen Do not take oral diabetes medicines (pills) the morning of surgery.  . THE NIGHT BEFORE SURGERY, take     units of       insulin.       . THE MORNING OF SURGERY, take   units of         insulin.  . The day of surgery, do not take other diabetes injectables, including Byetta (exenatide), Bydureon (exenatide ER), Victoza (liraglutide), or Trulicity (dulaglutide).  . If your CBG is greater than 220 mg/dL, you may take  of your sliding scale  . (correction) dose of insulin.    For patients with insulin pumps: Contact your diabetes doctor for specific instructions before surgery. Decrease basal rates by 20% at midnight the night before your surgery. Note that if your surgery is planned to be longer than 2 hours, your insulin pump will be removed and intravenous (IV) insulin will be started and managed by the nurses  and the anesthesiologist. You will be able to restart your insulin pump once you are awake and able to manage it.  Make sure to bring insulin pump supplies to the hospital with you in case the  site needs to be changed.  Patient Signature:  Date:   Nurse Signature:  Date:   Reviewed and Endorsed by [facility] Patient Education Committee, August 2015[facility] - Preparing for Surgery Before surgery, you can play an important role.  Because skin is not sterile, your skin needs to be as free of germs as possible.  You can reduce the number of germs on your skin by washing with CHG (chlorahexidine gluconate) soap before surgery.  CHG is an antiseptic cleaner which kills germs and bonds with the skin to continue killing germs even after washing. Please DO NOT use if you have an allergy to CHG or antibacterial soaps.  If your skin becomes reddened/irritated stop using the CHG and inform your nurse when you arrive at Short Stay. Do not shave (including legs and underarms) for at least 48 hours prior to the first CHG shower.  You may shave your face/neck. Please follow these instructions carefully:  1.  Shower with CHG Soap the night before surgery and the  morning of Surgery.  2.  If you choose to wash your hair, wash your hair first as usual with your  normal  shampoo.  3.  After you shampoo, rinse your hair and body thoroughly to remove the  shampoo.                           4.  Use CHG as you would any other liquid soap.  You can apply chg directly  to the skin and wash                       Gently with a scrungie or clean washcloth.  5.  Apply the CHG Soap to your body ONLY FROM THE NECK DOWN.   Do not use on face/ open                           Wound or open sores. Avoid contact with eyes, ears mouth and genitals (private parts).  Wash face,  Genitals (private parts) with your normal soap.             6.  Wash thoroughly, paying special attention to the area where your  surgery  will be performed.  7.  Thoroughly rinse your body with warm water from the neck down.  8.  DO NOT shower/wash with your normal soap after using and rinsing off  the CHG Soap.                9.  Pat yourself dry with a clean towel.            10.  Wear clean pajamas.            11.  Place clean sheets on your bed the night of your first shower and do not  sleep with pets. Day of Surgery : Do not apply any lotions/deodorants the morning of surgery.  Please wear clean clothes to the hospital/surgery center.  FAILURE TO FOLLOW THESE INSTRUCTIONS MAY RESULT IN THE CANCELLATION OF YOUR SURGERY PATIENT SIGNATURE_________________________________  NURSE SIGNATURE__________________________________  ________________________________________________________________________  WHAT IS A BLOOD TRANSFUSION? Blood Transfusion Information  A transfusion is the replacement of blood or some of its parts. Blood is made up of multiple cells which provide different functions.  Red blood cells carry oxygen and are used for blood loss replacement.  White blood cells fight against infection.  Platelets control bleeding.  Plasma helps clot blood.  Other blood products are available for specialized needs, such as hemophilia or other clotting disorders. BEFORE THE TRANSFUSION  Who gives blood for transfusions?   Healthy volunteers who are fully evaluated to make sure their blood is safe. This is blood bank blood. Transfusion therapy is the safest it has ever been in the practice of medicine. Before blood is taken from a donor, a complete history is taken to make sure that person has no history of diseases nor engages in risky social behavior (examples are intravenous drug use or sexual activity with multiple partners). The donor's travel history is screened to minimize risk of transmitting infections, such as malaria. The donated blood is tested for signs of infectious diseases, such as HIV and  hepatitis. The blood is then tested to be sure it is compatible with you in order to minimize the chance of a transfusion reaction. If you or a relative donates blood, this is often done in anticipation of surgery and is not appropriate for emergency situations. It takes many days to process the donated blood. RISKS AND COMPLICATIONS Although transfusion therapy is very safe and saves many lives, the main dangers of transfusion include:   Getting an infectious disease.  Developing a transfusion reaction. This is an allergic reaction to something in the blood you were given. Every precaution is taken to prevent this. The decision to have a blood transfusion has been considered carefully by your caregiver before blood is given. Blood is not given unless the benefits outweigh the risks. AFTER THE TRANSFUSION  Right after receiving a blood transfusion, you will usually feel much better and more energetic. This is especially true if your red blood cells have gotten low (anemic). The transfusion raises the level of the red blood cells which carry oxygen, and this usually causes an energy increase.  The nurse administering the transfusion will monitor you carefully for complications. HOME CARE INSTRUCTIONS  No special instructions are needed after a transfusion. You may find your energy is better. Speak with your caregiver about any  limitations on activity for underlying diseases you may have. SEEK MEDICAL CARE IF:   Your condition is not improving after your transfusion.  You develop redness or irritation at the intravenous (IV) site. SEEK IMMEDIATE MEDICAL CARE IF:  Any of the following symptoms occur over the next 12 hours:  Shaking chills.  You have a temperature by mouth above 102 F (38.9 C), not controlled by medicine.  Chest, back, or muscle pain.  People around you feel you are not acting correctly or are confused.  Shortness of breath or difficulty breathing.  Dizziness and  fainting.  You get a rash or develop hives.  You have a decrease in urine output.  Your urine turns a dark color or changes to pink, red, or brown. Any of the following symptoms occur over the next 10 days:  You have a temperature by mouth above 102 F (38.9 C), not controlled by medicine.  Shortness of breath.  Weakness after normal activity.  The white part of the eye turns yellow (jaundice).  You have a decrease in the amount of urine or are urinating less often.  Your urine turns a dark color or changes to pink, red, or brown. Document Released: 01/14/2000 Document Revised: 04/10/2011 Document Reviewed: 09/02/2007 [facility] Patient Information 2014 [place], [place].  _______________________________________________________________________

## 2016-05-31 ENCOUNTER — Encounter (HOSPITAL_COMMUNITY)
Admission: RE | Admit: 2016-05-31 | Discharge: 2016-05-31 | Disposition: A | Discharge status: Home / Self Care | Payer: Medicare HMO | Source: Ambulatory Visit | Attending: Urology | Admitting: Urology

## 2016-05-31 ENCOUNTER — Encounter (HOSPITAL_COMMUNITY): Payer: Self-pay

## 2016-05-31 DIAGNOSIS — D649 Anemia, unspecified: Secondary | ICD-10-CM | POA: Diagnosis not present

## 2016-05-31 DIAGNOSIS — J95821 Acute postprocedural respiratory failure: Secondary | ICD-10-CM | POA: Diagnosis not present

## 2016-05-31 DIAGNOSIS — Z87442 Personal history of urinary calculi: Secondary | ICD-10-CM | POA: Diagnosis not present

## 2016-05-31 DIAGNOSIS — K8020 Calculus of gallbladder without cholecystitis without obstruction: Secondary | ICD-10-CM | POA: Diagnosis not present

## 2016-05-31 DIAGNOSIS — Z87891 Personal history of nicotine dependence: Secondary | ICD-10-CM | POA: Diagnosis not present

## 2016-05-31 DIAGNOSIS — J988 Other specified respiratory disorders: Secondary | ICD-10-CM | POA: Diagnosis not present

## 2016-05-31 DIAGNOSIS — J45909 Unspecified asthma, uncomplicated: Secondary | ICD-10-CM | POA: Diagnosis not present

## 2016-05-31 DIAGNOSIS — Z9119 Patient's noncompliance with other medical treatment and regimen: Secondary | ICD-10-CM | POA: Diagnosis not present

## 2016-05-31 DIAGNOSIS — E7800 Pure hypercholesterolemia, unspecified: Secondary | ICD-10-CM | POA: Diagnosis present

## 2016-05-31 DIAGNOSIS — Z4682 Encounter for fitting and adjustment of non-vascular catheter: Secondary | ICD-10-CM | POA: Diagnosis not present

## 2016-05-31 DIAGNOSIS — G4710 Hypersomnia, unspecified: Secondary | ICD-10-CM | POA: Diagnosis present

## 2016-05-31 DIAGNOSIS — N200 Calculus of kidney: Secondary | ICD-10-CM | POA: Diagnosis not present

## 2016-05-31 DIAGNOSIS — J9690 Respiratory failure, unspecified, unspecified whether with hypoxia or hypercapnia: Secondary | ICD-10-CM | POA: Diagnosis not present

## 2016-05-31 DIAGNOSIS — Z79899 Other long term (current) drug therapy: Secondary | ICD-10-CM | POA: Diagnosis not present

## 2016-05-31 DIAGNOSIS — N202 Calculus of kidney with calculus of ureter: Secondary | ICD-10-CM | POA: Diagnosis not present

## 2016-05-31 DIAGNOSIS — Z794 Long term (current) use of insulin: Secondary | ICD-10-CM | POA: Diagnosis not present

## 2016-05-31 DIAGNOSIS — E785 Hyperlipidemia, unspecified: Secondary | ICD-10-CM | POA: Diagnosis present

## 2016-05-31 DIAGNOSIS — E119 Type 2 diabetes mellitus without complications: Secondary | ICD-10-CM | POA: Diagnosis not present

## 2016-05-31 DIAGNOSIS — J9601 Acute respiratory failure with hypoxia: Secondary | ICD-10-CM | POA: Diagnosis not present

## 2016-05-31 DIAGNOSIS — N179 Acute kidney failure, unspecified: Secondary | ICD-10-CM | POA: Diagnosis not present

## 2016-05-31 DIAGNOSIS — G4733 Obstructive sleep apnea (adult) (pediatric): Secondary | ICD-10-CM | POA: Diagnosis not present

## 2016-05-31 DIAGNOSIS — Z9889 Other specified postprocedural states: Secondary | ICD-10-CM | POA: Diagnosis not present

## 2016-05-31 DIAGNOSIS — Z8673 Personal history of transient ischemic attack (TIA), and cerebral infarction without residual deficits: Secondary | ICD-10-CM | POA: Diagnosis not present

## 2016-05-31 DIAGNOSIS — J9600 Acute respiratory failure, unspecified whether with hypoxia or hypercapnia: Secondary | ICD-10-CM | POA: Diagnosis not present

## 2016-05-31 DIAGNOSIS — Z96643 Presence of artificial hip joint, bilateral: Secondary | ICD-10-CM | POA: Diagnosis present

## 2016-05-31 DIAGNOSIS — J441 Chronic obstructive pulmonary disease with (acute) exacerbation: Secondary | ICD-10-CM | POA: Diagnosis not present

## 2016-05-31 DIAGNOSIS — I10 Essential (primary) hypertension: Secondary | ICD-10-CM | POA: Diagnosis present

## 2016-05-31 DIAGNOSIS — G2581 Restless legs syndrome: Secondary | ICD-10-CM | POA: Diagnosis present

## 2016-05-31 DIAGNOSIS — Z466 Encounter for fitting and adjustment of urinary device: Secondary | ICD-10-CM | POA: Diagnosis not present

## 2016-05-31 HISTORY — DX: Chronic obstructive pulmonary disease, unspecified: J44.9

## 2016-05-31 LAB — BASIC METABOLIC PANEL
ANION GAP: 8 (ref 5–15)
BUN: 15 mg/dL (ref 6–20)
CALCIUM: 8.7 mg/dL — AB (ref 8.9–10.3)
CO2: 27 mmol/L (ref 22–32)
Chloride: 105 mmol/L (ref 101–111)
Creatinine, Ser: 1 mg/dL (ref 0.61–1.24)
GFR calc non Af Amer: >60 mL/min (ref >60)
Glucose, Bld: 138 mg/dL — ABNORMAL HIGH (ref 65–99)
Potassium: 4.8 mmol/L (ref 3.5–5.1)
Sodium: 140 mmol/L (ref 135–145)

## 2016-05-31 LAB — CBC
HCT: 39.6 % (ref 39.0–52.0)
HEMOGLOBIN: 12.7 g/dL — AB (ref 13.0–17.0)
MCH: 30.8 pg (ref 26.0–34.0)
MCHC: 32.1 g/dL (ref 30.0–36.0)
MCV: 96.1 fL (ref 78.0–100.0)
Platelets: 206 10*3/uL (ref 150–400)
RBC: 4.12 MIL/uL — AB (ref 4.22–5.81)
RDW: 14.9 % (ref 11.5–15.5)
WBC: 10.6 10*3/uL — ABNORMAL HIGH (ref 4.0–10.5)

## 2016-05-31 LAB — GLUCOSE, CAPILLARY: GLUCOSE-CAPILLARY: 141 mg/dL — AB (ref 65–99)

## 2016-05-31 LAB — ABO/RH: ABO/RH(D): O POS

## 2016-05-31 NOTE — Patient Instructions (Addendum)
Lucas Clark  05/31/2016   Your procedure is scheduled on: 06/02/16  Report to [facility] Main  Entrance Take [place]  elevators to 3rd floor to  Short Stay Center at    0530 AM.     Call this number if you have problems the morning of surgery [phone]    Remember: ONLY 1 PERSON MAY GO WITH YOU TO SHORT STAY TO GET  READY MORNING OF YOUR SURGERY.  Do not eat food or drink liquids :After Midnight.     Take these medicines the morning of surgery with A SIP OF WATER: Norvasc(amlodipine), inhaler and bring, zyrtec, clonozepam DO NOT TAKE ANY    Oral  DIABETIC MEDICATIONS DAY OF YOUR SURGERY                               You may not have any metal on your body including hair pins and              piercings  Do not wear jewelry,lotions, powders or perfumes, deodorant           .              Men may shave face and neck.   Do not bring valuables to the hospital. [facility] IS NOT             RESPONSIBLE   FOR VALUABLES.  Contacts, dentures or bridgework may not be worn into surgery.  Leave suitcase in the car. After surgery it may be brought to your room.                  Please read over the following fact sheets you were given: _____________________________________________________________________             [facility] - Preparing for Surgery Before surgery, you can play an important role.  Because skin is not sterile, your skin needs to be as free of germs as possible.  You can reduce the number of germs on your skin by washing with CHG (chlorahexidine gluconate) soap before surgery.  CHG is an antiseptic cleaner which kills germs and bonds with the skin to continue killing germs even after washing. Please DO NOT use if you have an allergy to CHG or antibacterial soaps.  If your skin becomes reddened/irritated stop using the CHG and inform your nurse when you arrive at Short Stay. Do not shave (including legs and underarms) for at least 48 hours prior to  the first CHG shower.  You may shave your face/neck. Please follow these instructions carefully:  1.  Shower with CHG Soap the night before surgery and the  morning of Surgery.  2.  If you choose to wash your hair, wash your hair first as usual with your  normal  shampoo.  3.  After you shampoo, rinse your hair and body thoroughly to remove the  shampoo.                           4.  Use CHG as you would any other liquid soap.  You can apply chg directly  to the skin and wash                       Gently with a scrungie or clean  washcloth.  5.  Apply the CHG Soap to your body ONLY FROM THE NECK DOWN.   Do not use on face/ open                           Wound or open sores. Avoid contact with eyes, ears mouth and genitals (private parts).                       Wash face,  Genitals (private parts) with your normal soap.             6.  Wash thoroughly, paying special attention to the area where your surgery  will be performed.  7.  Thoroughly rinse your body with warm water from the neck down.  8.  DO NOT shower/wash with your normal soap after using and rinsing off  the CHG Soap.                9.  Pat yourself dry with a clean towel.            10.  Wear clean pajamas.            11.  Place clean sheets on your bed the night of your first shower and do not  sleep with pets. Day of Surgery : Do not apply any lotions/deodorants the morning of surgery.  Please wear clean clothes to the hospital/surgery center.  FAILURE TO FOLLOW THESE INSTRUCTIONS MAY RESULT IN THE CANCELLATION OF YOUR SURGERY PATIENT SIGNATURE_________________________________  NURSE SIGNATURE__________________________________  ________________________________________________________________________ How to Manage Your Diabetes Before and After Surgery  Why is it important to control my blood sugar before and after surgery? . Improving blood sugar levels before and after surgery helps healing and can limit problems. . A way  of improving blood sugar control is eating a healthy diet by: o  Eating less sugar and carbohydrates o  Increasing activity/exercise o  Talking with your doctor about reaching your blood sugar goals . High blood sugars (greater than 180 mg/dL) can raise your risk of infections and slow your recovery, so you will need to focus on controlling your diabetes during the weeks before surgery. . Make sure that the doctor who takes care of your diabetes knows about your planned surgery including the date and location.  How do I manage my blood sugar before surgery? . Check your blood sugar at least 4 times a day, starting 2 days before surgery, to make sure that the level is not too high or low. o Check your blood sugar the morning of your surgery when you wake up and every 2 hours until you get to the Short Stay unit. . If your blood sugar is less than 70 mg/dL, you will need to treat for low blood sugar: o Do not take insulin. o Treat a low blood sugar (less than 70 mg/dL) with  cup of clear juice (cranberry or apple), 4 glucose tablets, OR glucose gel. o Recheck blood sugar in 15 minutes after treatment (to make sure it is greater than 70 mg/dL). If your blood sugar is not greater than 70 mg/dL on recheck, call [phone] for further instructions. . Report your blood sugar to the short stay nurse when you get to Short Stay.  . If you are admitted to the hospital after surgery: o Your blood sugar will be checked by the staff and you will probably be given insulin after surgery (instead of  oral diabetes medicines) to make sure you have good blood sugar levels. o The goal for blood sugar control after surgery is 80-180 mg/dL.   WHAT DO I DO ABOUT MY DIABETES MEDICATION?  Day before surgery only take glipizide morning and lunch, Metformin take as usual day before, actos take as usual day before surgery  . Do not take oral diabetes medicines (pills) the morning of surgery.  . THE NIGHT BEFORE  SURGERY, take   0  units of       insulin.       . THE MORNING OF SURGERY, take 10   units of   Lantus      insulin.  . The day of surgery, do not take other diabetes injectables, including Byetta (exenatide), Bydureon (exenatide ER), Victoza (liraglutide), or Trulicity (dulaglutide).       Patient Signature:  Date:   Nurse Signature:  Date:   Reviewed and Endorsed by [facility] Patient Education Committee, August 2015

## 2016-05-31 NOTE — Progress Notes (Signed)
Ct chest 05/15/16 epic

## 2016-06-01 LAB — URINE CULTURE: Culture: NO GROWTH

## 2016-06-01 LAB — HEMOGLOBIN A1C
Hgb A1c MFr Bld: 6.9 % — ABNORMAL HIGH (ref 4.8–5.6)
Mean Plasma Glucose: 151 mg/dL

## 2016-06-01 MED ORDER — DEXTROSE 5 % IV SOLN
3.0000 g | INTRAVENOUS | Status: AC
  Administered 2016-06-02: 3 g via INTRAVENOUS
  Filled 2016-06-01: qty 3

## 2016-06-01 NOTE — Anesthesia Preprocedure Evaluation (Addendum)
Anesthesia Evaluation  Patient identified by MRN, date of birth, ID band Patient awake    Reviewed: Allergy & Precautions, NPO status , Patient's Chart, lab work & pertinent test results  History of Anesthesia Complications Negative for: history of anesthetic complications  Airway Mallampati: III  TM Distance: >3 FB Neck ROM: Full    Dental  (+) Edentulous Upper, Partial Lower, Dental Advisory Given   Pulmonary asthma , sleep apnea , COPD, former smoker,    Pulmonary exam normal        Cardiovascular hypertension, Pt. on medications Normal cardiovascular exam     Neuro/Psych PSYCHIATRIC DISORDERS Anxiety Depression CVA    GI/Hepatic Neg liver ROS, History noted. CG   Endo/Other  diabetesMorbid obesity  Renal/GU Renal disease     Musculoskeletal  (+) Arthritis ,   Abdominal   Peds  Hematology   Anesthesia Other Findings   Reproductive/Obstetrics                            Anesthesia Physical  Anesthesia Plan  ASA: III  Anesthesia Plan: General   Post-op Pain Management:    Induction: Intravenous  Airway Management Planned:   Additional Equipment:   Intra-op Plan:   Post-operative Plan: Extubation in OR  Informed Consent: I have reviewed the patients History and Physical, chart, labs and discussed the procedure including the risks, benefits and alternatives for the proposed anesthesia with the patient or authorized representative who has indicated his/her understanding and acceptance.   Dental advisory given  Plan Discussed with: CRNA and Anesthesiologist  Anesthesia Plan Comments:        Anesthesia Quick Evaluation

## 2016-06-02 ENCOUNTER — Inpatient Hospital Stay (HOSPITAL_COMMUNITY)
Admission: AD | Admit: 2016-06-02 | Discharge: 2016-06-04 | DRG: 659 | Disposition: A | Discharge status: Home / Self Care | Payer: Medicare HMO | Source: Ambulatory Visit | Attending: Urology | Admitting: Urology

## 2016-06-02 ENCOUNTER — Inpatient Hospital Stay (HOSPITAL_COMMUNITY): Payer: Medicare HMO

## 2016-06-02 ENCOUNTER — Ambulatory Visit (HOSPITAL_COMMUNITY): Payer: Medicare HMO

## 2016-06-02 ENCOUNTER — Observation Stay (HOSPITAL_COMMUNITY): Payer: Medicare HMO

## 2016-06-02 ENCOUNTER — Ambulatory Visit (HOSPITAL_COMMUNITY): Payer: Medicare HMO | Admitting: Anesthesiology

## 2016-06-02 ENCOUNTER — Encounter (HOSPITAL_COMMUNITY): Payer: Self-pay | Admitting: *Deleted

## 2016-06-02 ENCOUNTER — Encounter (HOSPITAL_COMMUNITY)
Admission: AD | Disposition: A | Discharge status: Home / Self Care | Payer: Self-pay | Source: Ambulatory Visit | Attending: Urology

## 2016-06-02 DIAGNOSIS — Z87442 Personal history of urinary calculi: Secondary | ICD-10-CM

## 2016-06-02 DIAGNOSIS — Z96643 Presence of artificial hip joint, bilateral: Secondary | ICD-10-CM | POA: Diagnosis present

## 2016-06-02 DIAGNOSIS — E785 Hyperlipidemia, unspecified: Secondary | ICD-10-CM | POA: Diagnosis present

## 2016-06-02 DIAGNOSIS — E119 Type 2 diabetes mellitus without complications: Secondary | ICD-10-CM | POA: Diagnosis present

## 2016-06-02 DIAGNOSIS — Z9889 Other specified postprocedural states: Secondary | ICD-10-CM

## 2016-06-02 DIAGNOSIS — J441 Chronic obstructive pulmonary disease with (acute) exacerbation: Secondary | ICD-10-CM | POA: Diagnosis not present

## 2016-06-02 DIAGNOSIS — D649 Anemia, unspecified: Secondary | ICD-10-CM | POA: Diagnosis present

## 2016-06-02 DIAGNOSIS — G2581 Restless legs syndrome: Secondary | ICD-10-CM | POA: Diagnosis present

## 2016-06-02 DIAGNOSIS — G4733 Obstructive sleep apnea (adult) (pediatric): Secondary | ICD-10-CM | POA: Diagnosis present

## 2016-06-02 DIAGNOSIS — J9601 Acute respiratory failure with hypoxia: Secondary | ICD-10-CM

## 2016-06-02 DIAGNOSIS — Z794 Long term (current) use of insulin: Secondary | ICD-10-CM

## 2016-06-02 DIAGNOSIS — Z8673 Personal history of transient ischemic attack (TIA), and cerebral infarction without residual deficits: Secondary | ICD-10-CM | POA: Diagnosis not present

## 2016-06-02 DIAGNOSIS — Z87891 Personal history of nicotine dependence: Secondary | ICD-10-CM | POA: Diagnosis not present

## 2016-06-02 DIAGNOSIS — Z79899 Other long term (current) drug therapy: Secondary | ICD-10-CM

## 2016-06-02 DIAGNOSIS — Z9119 Patient's noncompliance with other medical treatment and regimen: Secondary | ICD-10-CM | POA: Diagnosis not present

## 2016-06-02 DIAGNOSIS — J95821 Acute postprocedural respiratory failure: Secondary | ICD-10-CM | POA: Diagnosis not present

## 2016-06-02 DIAGNOSIS — G4710 Hypersomnia, unspecified: Secondary | ICD-10-CM | POA: Diagnosis present

## 2016-06-02 DIAGNOSIS — I10 Essential (primary) hypertension: Secondary | ICD-10-CM | POA: Diagnosis present

## 2016-06-02 DIAGNOSIS — J988 Other specified respiratory disorders: Secondary | ICD-10-CM | POA: Diagnosis not present

## 2016-06-02 DIAGNOSIS — N200 Calculus of kidney: Secondary | ICD-10-CM

## 2016-06-02 DIAGNOSIS — N179 Acute kidney failure, unspecified: Secondary | ICD-10-CM | POA: Diagnosis not present

## 2016-06-02 DIAGNOSIS — J45909 Unspecified asthma, uncomplicated: Secondary | ICD-10-CM | POA: Diagnosis not present

## 2016-06-02 DIAGNOSIS — N202 Calculus of kidney with calculus of ureter: Secondary | ICD-10-CM | POA: Diagnosis present

## 2016-06-02 DIAGNOSIS — Z9289 Personal history of other medical treatment: Secondary | ICD-10-CM

## 2016-06-02 DIAGNOSIS — Z4682 Encounter for fitting and adjustment of non-vascular catheter: Secondary | ICD-10-CM | POA: Diagnosis not present

## 2016-06-02 DIAGNOSIS — Z466 Encounter for fitting and adjustment of urinary device: Secondary | ICD-10-CM | POA: Diagnosis not present

## 2016-06-02 DIAGNOSIS — E7800 Pure hypercholesterolemia, unspecified: Secondary | ICD-10-CM | POA: Diagnosis present

## 2016-06-02 DIAGNOSIS — Z4659 Encounter for fitting and adjustment of other gastrointestinal appliance and device: Secondary | ICD-10-CM

## 2016-06-02 DIAGNOSIS — J9600 Acute respiratory failure, unspecified whether with hypoxia or hypercapnia: Secondary | ICD-10-CM

## 2016-06-02 HISTORY — PX: CYSTOSCOPY/URETEROSCOPY/HOLMIUM LASER/STENT PLACEMENT: SHX6546

## 2016-06-02 HISTORY — PX: NEPHROLITHOTOMY: SHX5134

## 2016-06-02 LAB — CBC
HCT: 34.5 % — ABNORMAL LOW (ref 39.0–52.0)
Hemoglobin: 10.6 g/dL — ABNORMAL LOW (ref 13.0–17.0)
MCH: 30.7 pg (ref 26.0–34.0)
MCHC: 30.7 g/dL (ref 30.0–36.0)
MCV: 100 fL (ref 78.0–100.0)
PLATELETS: 200 10*3/uL (ref 150–400)
RBC: 3.45 MIL/uL — AB (ref 4.22–5.81)
RDW: 15.2 % (ref 11.5–15.5)
WBC: 14.4 10*3/uL — AB (ref 4.0–10.5)

## 2016-06-02 LAB — COMPREHENSIVE METABOLIC PANEL
ALT: 20 U/L (ref 17–63)
AST: 35 U/L (ref 15–41)
Albumin: 2.9 g/dL — ABNORMAL LOW (ref 3.5–5.0)
Alkaline Phosphatase: 78 U/L (ref 38–126)
Anion gap: 7 (ref 5–15)
BUN: 19 mg/dL (ref 6–20)
CHLORIDE: 104 mmol/L (ref 101–111)
CO2: 27 mmol/L (ref 22–32)
Calcium: 7.6 mg/dL — ABNORMAL LOW (ref 8.9–10.3)
Creatinine, Ser: 1.75 mg/dL — ABNORMAL HIGH (ref 0.61–1.24)
GFR, EST AFRICAN AMERICAN: 45 mL/min — AB (ref >60)
GFR, EST NON AFRICAN AMERICAN: 39 mL/min — AB (ref >60)
Glucose, Bld: 165 mg/dL — ABNORMAL HIGH (ref 65–99)
POTASSIUM: 5.2 mmol/L — AB (ref 3.5–5.1)
Sodium: 138 mmol/L (ref 135–145)
Total Bilirubin: 0.3 mg/dL (ref 0.3–1.2)
Total Protein: 5.6 g/dL — ABNORMAL LOW (ref 6.5–8.1)

## 2016-06-02 LAB — BLOOD GAS, ARTERIAL
ACID-BASE DEFICIT: 2.1 mmol/L — AB (ref 0.0–2.0)
BICARBONATE: 24.9 mmol/L (ref 20.0–28.0)
Drawn by: 257701
FIO2: 60
LHR: 16 {breaths}/min
O2 SAT: 99.1 %
PEEP/CPAP: 5 cmH2O
PH ART: 7.268 — AB (ref 7.350–7.450)
Patient temperature: 98.6
VT: 480 mL
pCO2 arterial: 56.2 mmHg — ABNORMAL HIGH (ref 32.0–48.0)
pO2, Arterial: 219 mmHg — ABNORMAL HIGH (ref 83.0–108.0)

## 2016-06-02 LAB — BASIC METABOLIC PANEL
Anion gap: 8 (ref 5–15)
BUN: 18 mg/dL (ref 6–20)
CO2: 27 mmol/L (ref 22–32)
CREATININE: 1.74 mg/dL — AB (ref 0.61–1.24)
Calcium: 7.8 mg/dL — ABNORMAL LOW (ref 8.9–10.3)
Chloride: 104 mmol/L (ref 101–111)
GFR calc Af Amer: 45 mL/min — ABNORMAL LOW (ref >60)
GFR, EST NON AFRICAN AMERICAN: 39 mL/min — AB (ref >60)
GLUCOSE: 193 mg/dL — AB (ref 65–99)
POTASSIUM: 6 mmol/L — AB (ref 3.5–5.1)
SODIUM: 139 mmol/L (ref 135–145)

## 2016-06-02 LAB — TYPE AND SCREEN
ABO/RH(D): O POS
ANTIBODY SCREEN: NEGATIVE

## 2016-06-02 LAB — MRSA PCR SCREENING: MRSA by PCR: NEGATIVE

## 2016-06-02 LAB — GLUCOSE, CAPILLARY
GLUCOSE-CAPILLARY: 191 mg/dL — AB (ref 65–99)
GLUCOSE-CAPILLARY: 154 mg/dL — AB (ref 65–99)
Glucose-Capillary: 110 mg/dL — ABNORMAL HIGH (ref 65–99)
Glucose-Capillary: 178 mg/dL — ABNORMAL HIGH (ref 65–99)

## 2016-06-02 SURGERY — CYSTOSCOPY/URETEROSCOPY/HOLMIUM LASER/STENT PLACEMENT
Anesthesia: General | Laterality: Right

## 2016-06-02 MED ORDER — SUCCINYLCHOLINE CHLORIDE 200 MG/10ML IV SOSY
PREFILLED_SYRINGE | INTRAVENOUS | Status: AC
  Filled 2016-06-02: qty 10

## 2016-06-02 MED ORDER — SODIUM CHLORIDE 0.9 % IR SOLN
Status: DC | PRN
  Administered 2016-06-02: 4000 mL

## 2016-06-02 MED ORDER — LACTATED RINGERS IV SOLN
INTRAVENOUS | Status: DC | PRN
Start: 2016-06-02 — End: 2016-06-02
  Administered 2016-06-02 (×3): via INTRAVENOUS

## 2016-06-02 MED ORDER — INSULIN GLARGINE 100 UNIT/ML ~~LOC~~ SOLN
20.0000 [IU] | Freq: Every day | SUBCUTANEOUS | Status: DC
  Administered 2016-06-03 – 2016-06-04 (×2): 20 [IU] via SUBCUTANEOUS
  Filled 2016-06-02 (×2): qty 0.2

## 2016-06-02 MED ORDER — ONDANSETRON HCL 4 MG/2ML IJ SOLN
INTRAMUSCULAR | Status: AC
  Filled 2016-06-02: qty 2

## 2016-06-02 MED ORDER — DEXAMETHASONE SODIUM PHOSPHATE 10 MG/ML IJ SOLN
INTRAMUSCULAR | Status: AC
  Filled 2016-06-02: qty 1

## 2016-06-02 MED ORDER — CHLORHEXIDINE GLUCONATE 0.12% ORAL RINSE (MEDLINE KIT)
15.0000 mL | Freq: Two times a day (BID) | OROMUCOSAL | Status: DC
  Administered 2016-06-02 – 2016-06-03 (×3): 15 mL via OROMUCOSAL

## 2016-06-02 MED ORDER — ROCURONIUM BROMIDE 50 MG/5ML IV SOSY
PREFILLED_SYRINGE | INTRAVENOUS | Status: AC
  Filled 2016-06-02: qty 5

## 2016-06-02 MED ORDER — PROPOFOL 10 MG/ML IV BOLUS
INTRAVENOUS | Status: AC
  Filled 2016-06-02: qty 20

## 2016-06-02 MED ORDER — METHYLPREDNISOLONE SODIUM SUCC 40 MG IJ SOLR
40.0000 mg | Freq: Two times a day (BID) | INTRAMUSCULAR | Status: DC
  Administered 2016-06-02 – 2016-06-03 (×2): 40 mg via INTRAVENOUS
  Filled 2016-06-02 (×2): qty 1

## 2016-06-02 MED ORDER — SODIUM CHLORIDE 0.9 % IR SOLN
Status: DC | PRN
  Administered 2016-06-02: 24000 mL

## 2016-06-02 MED ORDER — SUCCINYLCHOLINE CHLORIDE 200 MG/10ML IV SOSY
PREFILLED_SYRINGE | INTRAVENOUS | Status: DC | PRN
  Administered 2016-06-02 (×2): 140 mg via INTRAVENOUS

## 2016-06-02 MED ORDER — TRAMADOL HCL 50 MG PO TABS: 50.0000 mg | ORAL_TABLET | Freq: Four times a day (QID) | ORAL | 0 refills | Status: DC | PRN

## 2016-06-02 MED ORDER — ORAL CARE MOUTH RINSE
15.0000 mL | Freq: Four times a day (QID) | OROMUCOSAL | Status: DC
Start: 2016-06-03 — End: 2016-06-04
  Administered 2016-06-03 – 2016-06-04 (×3): 15 mL via OROMUCOSAL

## 2016-06-02 MED ORDER — LOSARTAN POTASSIUM 50 MG PO TABS: 100.0000 mg | ORAL_TABLET | Freq: Every day | ORAL | Status: DC

## 2016-06-02 MED ORDER — MOMETASONE FURO-FORMOTEROL FUM 200-5 MCG/ACT IN AERO
2.0000 | INHALATION_SPRAY | Freq: Two times a day (BID) | RESPIRATORY_TRACT | Status: DC
  Filled 2016-06-02: qty 8.8

## 2016-06-02 MED ORDER — MIDAZOLAM HCL 5 MG/5ML IJ SOLN
INTRAMUSCULAR | Status: DC | PRN
  Administered 2016-06-02: 2 mg via INTRAVENOUS

## 2016-06-02 MED ORDER — LABETALOL HCL 5 MG/ML IV SOLN
INTRAVENOUS | Status: AC
  Filled 2016-06-02: qty 4

## 2016-06-02 MED ORDER — GLIPIZIDE 10 MG PO TABS: 10.0000 mg | ORAL_TABLET | Freq: Two times a day (BID) | ORAL | Status: DC

## 2016-06-02 MED ORDER — AMLODIPINE BESYLATE 5 MG PO TABS
5.0000 mg | ORAL_TABLET | Freq: Every day | ORAL | Status: DC
  Administered 2016-06-03 – 2016-06-04 (×2): 5 mg
  Filled 2016-06-02 (×2): qty 1

## 2016-06-02 MED ORDER — MIDAZOLAM HCL 2 MG/2ML IJ SOLN
1.0000 mg | Freq: Once | INTRAMUSCULAR | Status: AC
  Administered 2016-06-02: 1 mg via INTRAVENOUS

## 2016-06-02 MED ORDER — CLONAZEPAM 1 MG PO TABS: 1.0000 mg | ORAL_TABLET | Freq: Three times a day (TID) | ORAL | Status: DC | PRN

## 2016-06-02 MED ORDER — LOPERAMIDE HCL 2 MG PO CAPS: 6.0000 mg | ORAL_CAPSULE | Freq: Two times a day (BID) | ORAL | Status: DC | PRN

## 2016-06-02 MED ORDER — PRAVASTATIN SODIUM 20 MG PO TABS: 40.0000 mg | ORAL_TABLET | Freq: Every evening | ORAL | Status: DC

## 2016-06-02 MED ORDER — MIDAZOLAM HCL 2 MG/2ML IJ SOLN
INTRAMUSCULAR | Status: AC
  Filled 2016-06-02: qty 2

## 2016-06-02 MED ORDER — IOHEXOL 300 MG/ML  SOLN
INTRAMUSCULAR | Status: DC | PRN
  Administered 2016-06-02: 54 mL

## 2016-06-02 MED ORDER — STERILE WATER FOR IRRIGATION IR SOLN
Status: DC | PRN
  Administered 2016-06-02: 500 mL

## 2016-06-02 MED ORDER — ROPINIROLE HCL 1 MG PO TABS
1.0000 mg | ORAL_TABLET | Freq: Every day | ORAL | Status: DC
  Administered 2016-06-02 – 2016-06-03 (×2): 1 mg
  Filled 2016-06-02 (×2): qty 1

## 2016-06-02 MED ORDER — PIOGLITAZONE HCL 15 MG PO TABS: 15.0000 mg | ORAL_TABLET | Freq: Every day | ORAL | Status: DC

## 2016-06-02 MED ORDER — LIDOCAINE 2% (20 MG/ML) 5 ML SYRINGE
INTRAMUSCULAR | Status: AC
  Filled 2016-06-02: qty 5

## 2016-06-02 MED ORDER — IPRATROPIUM-ALBUTEROL 0.5-2.5 (3) MG/3ML IN SOLN
3.0000 mL | Freq: Four times a day (QID) | RESPIRATORY_TRACT | Status: DC
  Administered 2016-06-02 – 2016-06-04 (×7): 3 mL via RESPIRATORY_TRACT
  Filled 2016-06-02 (×7): qty 3

## 2016-06-02 MED ORDER — ROCURONIUM BROMIDE 10 MG/ML (PF) SYRINGE
PREFILLED_SYRINGE | INTRAVENOUS | Status: DC | PRN
  Administered 2016-06-02: 10 mg via INTRAVENOUS
  Administered 2016-06-02: 20 mg via INTRAVENOUS
  Administered 2016-06-02: 10 mg via INTRAVENOUS
  Administered 2016-06-02: 50 mg via INTRAVENOUS
  Administered 2016-06-02 (×3): 20 mg via INTRAVENOUS

## 2016-06-02 MED ORDER — PANTOPRAZOLE SODIUM 40 MG PO PACK: 40.0000 mg | PACK | Freq: Every day | ORAL | Status: DC

## 2016-06-02 MED ORDER — HYDROMORPHONE HCL 1 MG/ML IJ SOLN: 0.2500 mg | INTRAMUSCULAR | Status: DC | PRN

## 2016-06-02 MED ORDER — ROPINIROLE HCL 5 MG PO TABS: 15.0000 mg | ORAL_TABLET | Freq: Every day | ORAL | Status: DC

## 2016-06-02 MED ORDER — MIDAZOLAM HCL 2 MG/2ML IJ SOLN: 1.0000 mg | INTRAMUSCULAR | Status: DC | PRN

## 2016-06-02 MED ORDER — FENTANYL CITRATE (PF) 100 MCG/2ML IJ SOLN
INTRAMUSCULAR | Status: DC | PRN
  Administered 2016-06-02 (×2): 50 ug via INTRAVENOUS
  Administered 2016-06-02: 100 ug via INTRAVENOUS
  Administered 2016-06-02: 50 ug via INTRAVENOUS

## 2016-06-02 MED ORDER — FENTANYL CITRATE (PF) 250 MCG/5ML IJ SOLN
INTRAMUSCULAR | Status: AC
  Filled 2016-06-02: qty 5

## 2016-06-02 MED ORDER — FENTANYL CITRATE (PF) 100 MCG/2ML IJ SOLN
50.0000 ug | INTRAMUSCULAR | Status: DC | PRN
  Administered 2016-06-02 – 2016-06-04 (×7): 50 ug via INTRAVENOUS
  Filled 2016-06-02 (×2): qty 2

## 2016-06-02 MED ORDER — DEXAMETHASONE SODIUM PHOSPHATE 10 MG/ML IJ SOLN
INTRAMUSCULAR | Status: DC | PRN
  Administered 2016-06-02: 6 mg via INTRAVENOUS

## 2016-06-02 MED ORDER — DEXTROSE 5 % IV SOLN
3.0000 g | INTRAVENOUS | Status: AC
  Administered 2016-06-02: 3 g via INTRAVENOUS
  Filled 2016-06-02: qty 3

## 2016-06-02 MED ORDER — PROPOFOL 10 MG/ML IV BOLUS
INTRAVENOUS | Status: DC | PRN
  Administered 2016-06-02: 100 mg via INTRAVENOUS
  Administered 2016-06-02: 170 mg via INTRAVENOUS

## 2016-06-02 MED ORDER — AMLODIPINE BESYLATE 5 MG PO TABS: 5.0000 mg | ORAL_TABLET | Freq: Every day | ORAL | Status: DC

## 2016-06-02 MED ORDER — METOPROLOL TARTRATE 5 MG/5ML IV SOLN
INTRAVENOUS | Status: DC | PRN
  Administered 2016-06-02 (×4): 1 mg via INTRAVENOUS

## 2016-06-02 MED ORDER — SUGAMMADEX SODIUM 500 MG/5ML IV SOLN
INTRAVENOUS | Status: AC
  Filled 2016-06-02: qty 5

## 2016-06-02 MED ORDER — BUPIVACAINE HCL 0.5 % IJ SOLN
INTRAMUSCULAR | Status: DC | PRN
  Administered 2016-06-02: 21 mL

## 2016-06-02 MED ORDER — CHLORHEXIDINE GLUCONATE 0.12 % MT SOLN
OROMUCOSAL | Status: AC
  Administered 2016-06-02: 15 mL via OROMUCOSAL
  Filled 2016-06-02: qty 15

## 2016-06-02 MED ORDER — PHENYLEPHRINE 40 MCG/ML (10ML) SYRINGE FOR IV PUSH (FOR BLOOD PRESSURE SUPPORT)
PREFILLED_SYRINGE | INTRAVENOUS | Status: DC | PRN
  Administered 2016-06-02 (×9): 80 ug via INTRAVENOUS

## 2016-06-02 MED ORDER — LOPERAMIDE HCL 2 MG PO CAPS: 6.0000 mg | ORAL_CAPSULE | Freq: Two times a day (BID) | ORAL | Status: DC

## 2016-06-02 MED ORDER — BUPIVACAINE HCL (PF) 0.5 % IJ SOLN
INTRAMUSCULAR | Status: AC
  Filled 2016-06-02: qty 30

## 2016-06-02 MED ORDER — CIPROFLOXACIN HCL 500 MG PO TABS
500.0000 mg | ORAL_TABLET | Freq: Two times a day (BID) | ORAL | Status: DC
  Administered 2016-06-02 – 2016-06-04 (×4): 500 mg
  Filled 2016-06-02 (×4): qty 1

## 2016-06-02 MED ORDER — LOSARTAN POTASSIUM 50 MG PO TABS
100.0000 mg | ORAL_TABLET | Freq: Every day | ORAL | Status: DC
  Administered 2016-06-03: 100 mg
  Filled 2016-06-02: qty 2

## 2016-06-02 MED ORDER — INSULIN ASPART 100 UNIT/ML ~~LOC~~ SOLN
0.0000 [IU] | SUBCUTANEOUS | Status: DC
  Administered 2016-06-02 (×2): 4 [IU] via SUBCUTANEOUS
  Administered 2016-06-03: 3 [IU] via SUBCUTANEOUS
  Administered 2016-06-03 (×2): 4 [IU] via SUBCUTANEOUS
  Administered 2016-06-03: 3 [IU] via SUBCUTANEOUS
  Administered 2016-06-03 (×2): 4 [IU] via SUBCUTANEOUS
  Administered 2016-06-04: 3 [IU] via SUBCUTANEOUS
  Administered 2016-06-04: 4 [IU] via SUBCUTANEOUS

## 2016-06-02 MED ORDER — HYDRALAZINE HCL 20 MG/ML IJ SOLN: 5.0000 mg | INTRAMUSCULAR | Status: DC | PRN

## 2016-06-02 MED ORDER — HYDROMORPHONE HCL 1 MG/ML IJ SOLN: 0.5000 mg | INTRAMUSCULAR | Status: DC | PRN

## 2016-06-02 MED ORDER — METFORMIN HCL 500 MG PO TABS: 1000.0000 mg | ORAL_TABLET | Freq: Two times a day (BID) | ORAL | Status: DC

## 2016-06-02 MED ORDER — CIPROFLOXACIN HCL 500 MG PO TABS: 500.0000 mg | ORAL_TABLET | Freq: Two times a day (BID) | ORAL | Status: DC

## 2016-06-02 MED ORDER — LABETALOL HCL 5 MG/ML IV SOLN
INTRAVENOUS | Status: DC | PRN
  Administered 2016-06-02: 5 mg via INTRAVENOUS

## 2016-06-02 MED ORDER — METOPROLOL TARTRATE 5 MG/5ML IV SOLN
INTRAVENOUS | Status: AC
  Filled 2016-06-02: qty 10

## 2016-06-02 MED ORDER — OXYCODONE HCL 5 MG PO TABS: 5.0000 mg | ORAL_TABLET | Freq: Four times a day (QID) | ORAL | Status: DC

## 2016-06-02 MED ORDER — LIDOCAINE-EPINEPHRINE (PF) 1 %-1:200000 IJ SOLN
INTRAMUSCULAR | Status: AC
  Filled 2016-06-02: qty 30

## 2016-06-02 MED ORDER — FENTANYL CITRATE (PF) 100 MCG/2ML IJ SOLN
50.0000 ug | INTRAMUSCULAR | Status: DC | PRN
  Administered 2016-06-02: 50 ug via INTRAVENOUS
  Filled 2016-06-02 (×6): qty 2

## 2016-06-02 MED ORDER — PROMETHAZINE HCL 25 MG/ML IJ SOLN: 6.2500 mg | INTRAMUSCULAR | Status: DC | PRN

## 2016-06-02 MED ORDER — ALBUTEROL SULFATE (2.5 MG/3ML) 0.083% IN NEBU: 2.5000 mg | INHALATION_SOLUTION | RESPIRATORY_TRACT | Status: DC | PRN

## 2016-06-02 MED ORDER — ACETAMINOPHEN 10 MG/ML IV SOLN
1000.0000 mg | Freq: Four times a day (QID) | INTRAVENOUS | Status: AC
  Administered 2016-06-02 – 2016-06-03 (×4): 1000 mg via INTRAVENOUS
  Filled 2016-06-02 (×6): qty 100

## 2016-06-02 MED ORDER — HYDROMORPHONE HCL 1 MG/ML IJ SOLN
INTRAMUSCULAR | Status: DC | PRN
  Administered 2016-06-02: 0.5 mg via INTRAVENOUS
  Administered 2016-06-02: 1 mg via INTRAVENOUS

## 2016-06-02 MED ORDER — HYDROMORPHONE HCL 2 MG/ML IJ SOLN
INTRAMUSCULAR | Status: AC
  Filled 2016-06-02: qty 1

## 2016-06-02 MED ORDER — LIDOCAINE 2% (20 MG/ML) 5 ML SYRINGE
INTRAMUSCULAR | Status: DC | PRN
  Administered 2016-06-02: 80 mg via INTRAVENOUS

## 2016-06-02 MED ORDER — SODIUM CHLORIDE 0.9 % IV SOLN
INTRAVENOUS | Status: DC
  Administered 2016-06-02 – 2016-06-03 (×2): via INTRAVENOUS

## 2016-06-02 MED ORDER — SCOPOLAMINE 1 MG/3DAYS TD PT72
1.0000 | MEDICATED_PATCH | TRANSDERMAL | Status: DC
  Administered 2016-06-02: 1.5 mg via TRANSDERMAL

## 2016-06-02 MED ORDER — PROPOFOL 10 MG/ML IV BOLUS
INTRAVENOUS | Status: AC
  Filled 2016-06-02: qty 40

## 2016-06-02 MED ORDER — ONDANSETRON HCL 4 MG/2ML IJ SOLN
INTRAMUSCULAR | Status: DC | PRN
  Administered 2016-06-02 (×2): 4 mg via INTRAVENOUS

## 2016-06-02 MED ORDER — PRAVASTATIN SODIUM 20 MG PO TABS
40.0000 mg | ORAL_TABLET | Freq: Every evening | ORAL | Status: DC
  Administered 2016-06-02 – 2016-06-03 (×2): 40 mg
  Filled 2016-06-02 (×2): qty 2

## 2016-06-02 MED ORDER — PROPOFOL 1000 MG/100ML IV EMUL
5.0000 ug/kg/min | INTRAVENOUS | Status: DC
  Administered 2016-06-02: 5 ug/kg/min via INTRAVENOUS
  Administered 2016-06-03: 15 ug/kg/min via INTRAVENOUS
  Filled 2016-06-02 (×2): qty 100

## 2016-06-02 MED ORDER — SUGAMMADEX SODIUM 500 MG/5ML IV SOLN
INTRAVENOUS | Status: DC | PRN
  Administered 2016-06-02: 500 mg via INTRAVENOUS

## 2016-06-02 MED ORDER — TIOTROPIUM BROMIDE MONOHYDRATE 18 MCG IN CAPS
18.0000 ug | ORAL_CAPSULE | Freq: Every evening | RESPIRATORY_TRACT | Status: DC
  Filled 2016-06-02: qty 5

## 2016-06-02 MED ORDER — ZOLPIDEM TARTRATE 5 MG PO TABS: 5.0000 mg | ORAL_TABLET | Freq: Every evening | ORAL | Status: DC | PRN

## 2016-06-02 MED ORDER — BELLADONNA ALKALOIDS-OPIUM 16.2-60 MG RE SUPP: 1.0000 | Freq: Three times a day (TID) | RECTAL | Status: DC | PRN

## 2016-06-02 MED ORDER — LACTATED RINGERS IV SOLN
INTRAVENOUS | Status: DC
  Administered 2016-06-02: 18:00:00 via INTRAVENOUS

## 2016-06-02 MED ORDER — DEXTROSE 5 % IV SOLN
5.0000 mg/kg | Freq: Once | INTRAVENOUS | Status: AC
  Administered 2016-06-02: 450 mg via INTRAVENOUS
  Filled 2016-06-02: qty 11.25

## 2016-06-02 MED ORDER — LIDOCAINE-EPINEPHRINE (PF) 1 %-1:200000 IJ SOLN
INTRAMUSCULAR | Status: DC | PRN
  Administered 2016-06-02: 21 mL

## 2016-06-02 SURGICAL SUPPLY — 76 items
APL SKNCLS STERI-STRIP NONHPOA (GAUZE/BANDAGES/DRESSINGS) ×4
BAG URINE DRAINAGE (UROLOGICAL SUPPLIES) ×8 IMPLANT
BAG URO CATCHER STRL LF (MISCELLANEOUS) ×4 IMPLANT
BASKET DAKOTA 1.9FR 11X120 (BASKET) IMPLANT
BASKET STNLS GEMINI 4WIRE 3FR (BASKET) IMPLANT
BASKET STONE 1.7 NGAGE (UROLOGICAL SUPPLIES) ×2 IMPLANT
BASKET STONE NCOMPASS (UROLOGICAL SUPPLIES) ×2 IMPLANT
BASKET ZERO TIP NITINOL 2.4FR (BASKET) ×4 IMPLANT
BENZOIN TINCTURE PRP APPL 2/3 (GAUZE/BANDAGES/DRESSINGS) ×8 IMPLANT
BLADE SURG 15 STRL LF DISP TIS (BLADE) ×2 IMPLANT
BLADE SURG 15 STRL SS (BLADE) ×4
BSKT STON RTRVL GEM 120X11 3FR (BASKET)
BSKT STON RTRVL ZERO TP 2.4FR (BASKET) ×4
CATCHER STONE W/TUBE ADAPTER (UROLOGICAL SUPPLIES) IMPLANT
CATH AINSWORTH 30CC 24FR (CATHETERS) ×2 IMPLANT
CATH FOLEY 2WAY SLVR  5CC 16FR (CATHETERS) ×2
CATH FOLEY 2WAY SLVR 5CC 16FR (CATHETERS) ×2 IMPLANT
CATH IMAGER II 65CM (CATHETERS) ×4 IMPLANT
CATH URET 5FR 28IN OPEN ENDED (CATHETERS) ×4 IMPLANT
CATH URET DUAL LUMEN 6-10FR 50 (CATHETERS) ×4 IMPLANT
CATH X-FORCE N30 NEPHROSTOMY (TUBING) ×4 IMPLANT
CHLORAPREP W/TINT 26ML (MISCELLANEOUS) ×4 IMPLANT
CLOTH BEACON ORANGE TIMEOUT ST (SAFETY) ×4 IMPLANT
COVER SURGICAL LIGHT HANDLE (MISCELLANEOUS) ×4 IMPLANT
DRAPE C-ARM 42X120 X-RAY (DRAPES) ×4 IMPLANT
DRAPE LINGEMAN PERC (DRAPES) ×4 IMPLANT
DRSG PAD ABDOMINAL 8X10 ST (GAUZE/BANDAGES/DRESSINGS) ×8 IMPLANT
DRSG TEGADERM 8X12 (GAUZE/BANDAGES/DRESSINGS) ×6 IMPLANT
FIBER LASER FLEXIVA 1000 (UROLOGICAL SUPPLIES) IMPLANT
FIBER LASER FLEXIVA 365 (UROLOGICAL SUPPLIES) IMPLANT
FIBER LASER FLEXIVA 550 (UROLOGICAL SUPPLIES) IMPLANT
FIBER LASER TRAC TIP (UROLOGICAL SUPPLIES) ×2 IMPLANT
GAUZE SPONGE 4X4 12PLY STRL (GAUZE/BANDAGES/DRESSINGS) ×4 IMPLANT
GLOVE BIOGEL M STRL SZ7.5 (GLOVE) ×4 IMPLANT
GOWN STRL REUS W/TWL XL LVL3 (GOWN DISPOSABLE) ×4 IMPLANT
GUIDEWIRE AMPLAZ .035X145 (WIRE) ×8 IMPLANT
GUIDEWIRE ANG ZIPWIRE 038X150 (WIRE) ×4 IMPLANT
GUIDEWIRE STR DUAL SENSOR (WIRE) ×6 IMPLANT
HLDR NDL AMPLATZ W/INSERTS (MISCELLANEOUS) ×2 IMPLANT
HOLDER NEEDLE AMPLATZ W/INSERT (MISCELLANEOUS) ×4 IMPLANT
IV SET EXTENSION CATH 6 NF (IV SETS) ×4 IMPLANT
KIT BASIN OR (CUSTOM PROCEDURE TRAY) ×4 IMPLANT
MANIFOLD NEPTUNE II (INSTRUMENTS) ×4 IMPLANT
NDL SPNL 20GX3.5 QUINCKE YW (NEEDLE) IMPLANT
NDL SPNL 22GX3.5 QUINCKE BK (NEEDLE) IMPLANT
NDL TROCAR 18X15 ECHO (NEEDLE) IMPLANT
NDL TROCAR 18X20 (NEEDLE) IMPLANT
NEEDLE SPNL 20GX3.5 QUINCKE YW (NEEDLE) ×4 IMPLANT
NEEDLE SPNL 22GX3.5 QUINCKE BK (NEEDLE) ×4 IMPLANT
NEEDLE TROCAR 18X15 ECHO (NEEDLE) IMPLANT
NEEDLE TROCAR 18X20 (NEEDLE) IMPLANT
NS IRRIG 1000ML POUR BTL (IV SOLUTION) ×4 IMPLANT
PACK CYSTO (CUSTOM PROCEDURE TRAY) ×4 IMPLANT
PAD ABD 8X10 STRL (GAUZE/BANDAGES/DRESSINGS) ×2 IMPLANT
PROBE LITHOCLAST ULTRA 3.8X403 (UROLOGICAL SUPPLIES) ×2 IMPLANT
PROBE PNEUMATIC 1.0MMX570MM (UROLOGICAL SUPPLIES) ×2 IMPLANT
PROBE PNEUMATIC 1.0X500 (ELECTROSURGICAL) ×2 IMPLANT
SHEATH ACCESS URETERAL 24CM (SHEATH) IMPLANT
SHEATH ACCESS URETERAL 38CM (SHEATH) IMPLANT
SHEATH ACCESS URETERAL 54CM (SHEATH) IMPLANT
SHEATH PEELAWAY SET 9 (SHEATH) ×4 IMPLANT
SPONGE LAP 4X18 X RAY DECT (DISPOSABLE) ×4 IMPLANT
STENT URET 6FRX24 CONTOUR (STENTS) ×4 IMPLANT
STONE CATCHER W/TUBE ADAPTER (UROLOGICAL SUPPLIES) ×4 IMPLANT
SUT ETHILON 2 0 PS N (SUTURE) ×4 IMPLANT
SUT SILK 2 0 30  PSL (SUTURE) ×4
SUT SILK 2 0 30 PSL (SUTURE) IMPLANT
SYR 10ML LL (SYRINGE) ×4 IMPLANT
SYR 20CC LL (SYRINGE) ×10 IMPLANT
SYR 50ML LL SCALE MARK (SYRINGE) ×4 IMPLANT
SYRINGE IRR TOOMEY STRL 70CC (SYRINGE) ×2 IMPLANT
TOWEL OR 17X26 10 PK STRL BLUE (TOWEL DISPOSABLE) ×4 IMPLANT
TRAY CATH 16FR W/PLASTIC CATH (SET/KITS/TRAYS/PACK) ×2 IMPLANT
TUBING CONNECTING 10 (TUBING) ×6 IMPLANT
TUBING CONNECTING 10' (TUBING) ×2
WIRE COONS/BENSON .038X145CM (WIRE) IMPLANT

## 2016-06-02 NOTE — Progress Notes (Addendum)
CBC and B met  Drawn by lab.

## 2016-06-02 NOTE — Progress Notes (Signed)
Patient  Becoming apenic- CRNA bagging patient

## 2016-06-02 NOTE — Progress Notes (Signed)
Patient intubated - See Anesthesia NOTES

## 2016-06-02 NOTE — Progress Notes (Signed)
Portable Chest X-ray done.

## 2016-06-02 NOTE — H&P (View-Only) (Signed)
History of present illness: 66 year old male who presented to the emergency department yesterday afternoon with progressive bilateral flank and pelvic pain. The patient's symptoms actually began several days prior to that. They progressed to the point where he needed pain medicine. He was unable to void since one day prior. He did have some dysuria and the feeling of having to urinate. He denies any fevers. He denies any nausea or vomiting. His pain is 8/10 currently. In the emergency department the patient underwent a CT scan demonstrating bilateral obstructing ureteral stones. The patient's urinalysis demonstrated nitrite positive urine with positive white cells and red cells. He was given 1 g of ceftriaxone and a urine culture was sent. His pain is been managed with IV Dilaudid. He has remained afebrile. He was transferred to [facility] from the [facility] emergency department for definitive management.  The patient has no history of kidney stones in the past.  Review of systems: A 12 point comprehensive review of systems was obtained and is negative unless otherwise stated in the history of present illness.  Patient Active Problem List   Diagnosis Date Noted  . Hydronephrosis 05/14/2016  . Nephrolithiasis 05/14/2016  . Vitreous hemorrhage of right eye (HCC) 05/23/2011  . RESTLESS LEGS SYNDROME 05/31/2007  . OBSTRUCTIVE SLEEP APNEA 02/15/2007    No current facility-administered medications on file prior to encounter.    Current Outpatient Prescriptions on File Prior to Encounter  Medication Sig Dispense Refill  . amLODipine (NORVASC) 5 MG tablet Take 5 mg by mouth daily.    . clonazePAM (KLONOPIN) 1 MG tablet Take 1 mg by mouth 3 (three) times daily as needed. For anxiety    . metFORMIN (GLUCOPHAGE) 500 MG tablet Take 500 mg by mouth 2 (two) times daily with a meal.    . pravastatin (PRAVACHOL) 40 MG tablet Take 40 mg by mouth daily.    . ropinirole (REQUIP) 5 MG tablet Take 15  mg by mouth at bedtime.    . sertraline (ZOLOFT) 50 MG tablet Take 50 mg by mouth daily.    Marland Kitchen tiotropium (SPIRIVA) 18 MCG inhalation capsule Place 18 mcg into inhaler and inhale daily.      Past Medical History:  Diagnosis Date  . Anxiety   . Arthritis    "little bit"  . Asthma    "a touch"  . Blood transfusion ~ 1964  . Bronchitis    "a touch"  . Depression   . High cholesterol   . Hypertension   . Restless legs   . Sleep apnea    last sleep study 2005. does not use cpap  . Stroke [facility]) ~2009   denies residual  . Type II diabetes mellitus (HCC)     Past Surgical History:  Procedure Laterality Date  . EYE SURGERY  25 years ago   R cataract excision, eye lid surgery on L eye as child  . GAS/FLUID EXCHANGE  05/23/2011   Procedure: GAS/FLUID EXCHANGE;  Surgeon: Sherrie George, MD;  Location: [facility] OR;  Service: Ophthalmology;  Laterality: Right;  . PARS PLANA VITRECTOMY  05/23/11   gas fluid exchange; membrane peel; right eye  . PARS PLANA VITRECTOMY  05/23/2011   Procedure: PARS PLANA VITRECTOMY WITH 25 GAUGE;  Surgeon: Sherrie George, MD;  Location: [facility] OR;  Service: Ophthalmology;  Laterality: Right;  Endolaser  . TOTAL HIP ARTHROPLASTY  08/10/2005   right  . TOTAL HIP ARTHROPLASTY  11/2005   left    Social History  Substance Use Topics  . Smoking status: Former Smoker    Packs/day: 4.00    Years: 20.00    Types: Cigarettes    Quit date: 10/01/1991  . Smokeless tobacco: Never Used  . Alcohol use Yes     Comment: occasional    History reviewed. No pertinent family history.  PE: Vitals:   05/14/16 1303 05/14/16 1941 05/14/16 2105 05/14/16 2236  BP: (!) 177/92 (!) 158/68 (!) 159/85 (!) 174/88  Pulse: 99 (!) 103 (!) 102 100  Resp: (!) 24 14 (!) 22 (!) 22  Temp: 98.9 F (37.2 C)   98.9 F (37.2 C)  TempSrc: Oral   Oral  SpO2: 97% 92% 95% 94%  Weight: 131.5 kg (290 lb)   133.1 kg (293 lb 6.9 oz)  Height: 5\' 4"  (1.626 m)   5\' 4"  (1.626 m)   Patient appears to be  in no acute distress  patient is alert and oriented x3 Atraumatic normocephalic head No cervical or supraclavicular lymphadenopathy appreciated No increased work of breathing, no audible wheezes/rhonchi Regular sinus rhythm/rate Abdomen is soft, nontender, nondistended, no CVA or suprapubic tenderness Lower extremities are symmetric without appreciable edema Grossly neurologically intact No identifiable skin lesions   Recent Labs  05/14/16 1559  WBC 13.3*  HGB 14.9  HCT 43.2    Recent Labs  05/14/16 1559  NA 133*  K 5.2*  CL 98*  CO2 24  GLUCOSE 157*  BUN 28*  CREATININE 2.31*  CALCIUM 9.1   No results for input(s): LABPT, INR in the last 72 hours. No results for input(s): LABURIN in the last 72 hours. Results for orders placed or performed during the hospital encounter of 05/14/16  Surgical pcr screen     Status: None   Collection Time: 05/15/16  1:52 AM  Result Value Ref Range Status   MRSA, PCR NEGATIVE NEGATIVE Final   Staphylococcus aureus NEGATIVE NEGATIVE Final    Comment:        The Xpert SA Assay (FDA approved for NASAL specimens in patients over 68 years of age), is one component of a comprehensive surveillance program.  Test performance has been validated by [facility] for patients greater than or equal to 68 year old. It is not intended to diagnose infection nor to guide or monitor treatment.     Imaging: I have independently reviewed the patient's CT scan which demonstrates a 10 x 6 mm stone at the right UPJ. In addition, the patient has a 4 mm partially obstructing stone at the left UPJ with small stones within the mid left ureter. He also has a 1.8 times 1.8 centimeter stone in the left renal pelvis.  An incidental finding was noted 1.5 centimeter lesion in the left lower lobe of the lung.  Imp: The patient has bilateral obstructing stones. He is oliguric with elevated creatinine. In addition, the patient has nitrite positive urine  concerning for infection.  Recommendations: Our plan is to take the patient to the operating room urgently for bilateral ureteral stent placement. I discussed this procedure with the patient in significant detail. I discussed with him the fact that following this operation he will need more definitive management of his stones moving forward which we will do once the patient's infection has been adequately treated. The patient has been admitted for observation, we'll likely be able to be discharged once the sensor placed and he is proven to be afebrile.   Berniece SalinesHERRICK, Bhumi Godbey W

## 2016-06-02 NOTE — Op Note (Signed)
Preoperative diagnosis:  1. Bilateral nephrolithiasis   Postoperative diagnosis:  1. same   Procedure: 1. Cystoscopy, right retrograde pyelogram with interpretation, right ureteroscopy/laser lithotripsy/stone removal, right stent exchange 2. Left percutaneous nephrolithotomy with surgeon access >2.0 centimeters, left antegrade nephrostogram, left ureteral stent exchange  Surgeon: Crist FatBenjamin W. Christiano Blandon, MD  Anesthesia: General  Complications: None  Intraoperative findings:  #1: The patient's right retrograde pyelogram demonstrated a large filling defect in the right renal pelvis likely from a large clot. There was also a small filling defect in the upper pole consistent with the patient's known stone. #2: Lower pole access was obtained in the left PCNL. The patient had only small visible residual fragments remaining. #3: 24 cm x 6 French double-J stents were exchanged bilaterally.   EBL: 350cc  Specimens: None  Indication: Lucas Clark is a 66 y.o. patient with bilateral ureteral and renal stones. He presented approximately 2 weeks prior with concern for infection and stents were placed.  After reviewing the management options for treatment, he elected to proceed with the above surgical procedure(s). We have discussed the potential benefits and risks of the procedure, side effects of the proposed treatment, the likelihood of the patient achieving the goals of the procedure, and any potential problems that might occur during the procedure or recuperation. Informed consent has been obtained.  Description of procedure:  The patient was taken to the operating room and general anesthesia was induced.  The patient was placed in the dorsal lithotomy position, prepped and draped in the usual sterile fashion, and preoperative antibiotics were administered. A preoperative time-out was performed.   I then passed a 30 JamaicaFrench 21 rigid cystoscope to the patient's urethra and into the bladder. The  patient's right stent was grasped and pulled to the urethral meatus. I was unable to wire and stented as such removed this entirely and then repassed the cystoscope. I then was able to get a stent wire up the right ureter and into the renal pelvis. I then placed a dual-lumen catheter over the wire performed retrograde pyelogram with the above findings. I then advanced a second wire up the dual-lumen catheter and removed the catheter over the wires. I then advanced a 12/14 JamaicaFrench times 39 cm ureteral access sheath up the patient's right ureter under fluoroscopic guidance. I removed the inner portion of the access sheath. I then used a flexible ureteroscope and encountered the stone in the upper pole. I used a 200  fiber with laser settings of point 6 J times 6 Hz to fragment the stone into several small pieces. The pieces were then grasped with the engage basket and brought down the ureter. Several of the stones were too large and needed to be re-fragmented within the ureter to allow passage through the sheath.  Once all the stone fragments had been removed I backed out the ureteral access sheath slowly ensuring that there were no additional fragments within the patient's right ureter. I then repassed a 21 French cystoscope over wire and placed a 24 cm times 6 French double-J stent over the wire using the Seldinger technique. A nice curl was noted in the patient's renal pelvis as well as in the bladder.  At this point I removed the patient's left stent with a stent grasper pointed out to the urethral meatus. Again, I was unable to advance the wire through the stent and remove those stent altogether. I then advanced a 0.038 sensor wire through the scope and was able to cannulate the  left ureter. I advanced the wire up into the renal pelvis and then exchanged the wire with a 18F open ended catheter.  A retrograde pyelogram was performed in routine fashion demonstrating a large filling defect in the renal pelvis as  well as the lower pole consistent with the patient's known stones. This point, a Foley catheter was placed and the open-ended catheter was secured to the Foley. The patient was subsequently placed in the supine position and then he was flipped prone onto a second operating room table.  Gel pads were placed along the lateral aspect of the abdomen to ensure that the ribs and the pelvis were fully padded. His belly was allowed to fall into the middle. His legs were secured to the table, his arms were placed at a 90 angle. We then reprepped and draped in the routine sterile fashion. A small incision was made through the drape to allow the open-ended catheter to the pulled into the field after it was reprepped with Betadine.  I then injected more contrast through the open-ended catheter and under fluoroscopic guidance using a puslike technique rotated the C-arm to 25 and pinpointed the lower pole calyx. Once into the calyx I removed the inner portion of the coaxial needle was able to advance a 0.038 sensor wire through the calyx and into the renal pelvis. I then removed the needle and advanced a a few graphic over wire. I used the angiographic catheter to guide the sensor wire down the ureter and into the bladder. I then advanced the angiographic catheter down over the wire into the bladder and removed the sensor wire and exchanged for a Super Stiff wire. I then dilated the access point with a 9-10 Peel-away catheter. I then advanced a second sensor wire through the dilator which I was able to easily pass down the ureter and into the bladder fluoroscopic guidance. I then advanced the angiographic catheter over the sensor wire and exchanged for a Super Stiff wire, giving me to Super Stiff wires: The bladder.  This point I then advanced a 30 [place] NephroMax balloon dilator over the working wire and passed it into the lower pole calyx under fluoroscopic guidance. This was then inflated and then the sheath advanced  over the balloon creating 30 [place] access into the lower pole. The scope was then set up and the balloon dilator removed.  We started the stone extraction by using a 2 prong graspers removing all the loose stones in the lower pole. We then advanced the nephroscope into the renal pelvis encountering the large pelvic stone. Using the lithoclast stone was broken into numerous small pieces, several of them suctioned through the lithoclast, others removed with a 2 prong grasper. All the large stone fragments were removed using this method. I then advanced the cystoscope through the access sheath and down into the upper pole. There was some clot in the midpole area down able to remove via basket, there were no stones noted in the upper pole calyx. The renal pelvis was noted to be relatively clean, there were some small stone fragments, small enough to pass. I then placed the tip of the cystoscope through the renal pelvis it appears 0.38 sensor wire through the renal pelvis and down into the bladder.  I then took one last check of any additional stone fragments in the lower pole and in the renal pelvis area and using the suction from the lithoclast attempted to get all the fragments. I then backloaded the sensor wire  through the nephroscope and passed a 24 cm times 6 French double-J ureteral stent over the wire and into the bladder under fluoroscopic guidance. I advanced the stent into the renal pelvis prior to removing the wire entirely.  I then advanced a 24 [place] Ainsworth catheter over the working wire and into the renal pelvis under fluoroscopic guidance. 3 mL of sterile water was inflated into the balloon to secure it in place. I then performed a completion nephrostogram noting no significant filling defects or contrast extravasation outside the collecting system demonstrating no large perforations. I removed the access sheath and cut it from around the catheter. I secured the catheter to the patient's skin  with 3-0 nylon. I irrigated the catheter to ensure that there were no large blood clots and that was draining adequately. There was no significant hematuria or gross bleeding from the site. I then injected approximately 40 mL of 1% lidocaine and quarter percent Marcaine mixed into and along the nephrostomy tube tract. The patient was subsequently flipped prone and attempts were made to extubate the patient. He quickly desatted and as such was reintubated.  The patient was then transferred to the ICU intubated, but hemodynamically stable.  Crist Fat, M.D.

## 2016-06-02 NOTE — Transfer of Care (Signed)
Immediate Anesthesia Transfer of Care Note  Patient: Chryl HeckDavid E Tober  Procedure(s) Performed: Procedure(s): RIGHT URETEROSCOPY/HOLMIUM LASER/BASKET EXTRACTION OF RIGHT URETERAL STONE/STENT EXCHANGE (Right) LEFT NEPHROLITHOTOMY PERCUTANEOUS WITH SURGEON ACCESS (Left)  Patient Location: PACU  Anesthesia Type:General  Level of Consciousness: sedated, unresponsive, patient cooperative and Patient remains intubated per anesthesia plan  Airway & Oxygen Therapy: Patient re-intubated, Patient remains intubated per anesthesia plan and Patient placed on Ventilator (see vital sign flow sheet for setting)  Post-op Assessment: Report given to RN  Post vital signs: Reviewed and stable  Last Vitals:  Vitals:   06/02/16 0535  BP: 134/78  Pulse: 88  Resp: 20  Temp: 37.1 C    Last Pain:  Vitals:   06/02/16 0535  TempSrc: Oral      Patients Stated Pain Goal: 3 (06/02/16 0615)  Complications: Patient re-intubated

## 2016-06-02 NOTE — Progress Notes (Signed)
Patient placed on ventilator- SEE RT NOTES

## 2016-06-02 NOTE — Progress Notes (Signed)
eLink Physician-Brief Progress Note Patient Name: Lucas Clark DOB: [birth year] MRN: [id number]   Date of Service  06/02/2016  HPI/Events of Note  ABG = 60%/PRVC 16/TV 480/P 5 = 7.26/56/219/24.9  eICU Interventions  Will order: 1. Increase PRVC rate to 20. 2. ABG at 7:30 PM. 3. Propofol IV infusion. Titrate to RASS = 0 to -1.      Intervention Category Major Interventions: Acid-Base disturbance - evaluation and management Intermediate Interventions: Respiratory distress - evaluation and management  Sommer,Steven Eugene 06/02/2016, 6:20 PM

## 2016-06-02 NOTE — Progress Notes (Signed)
Portable Chest X-ray results noted. 

## 2016-06-02 NOTE — Addendum Note (Signed)
Addendum  created 06/02/16 1914 by Theodosia QuayKey, Ghazal Pevey, CRNA   Anesthesia Intra Meds edited, Charge Capture section accepted

## 2016-06-02 NOTE — Interval H&P Note (Signed)
History and Physical Interval Note:  06/02/2016 6:04 AM  Lucas Clark  has presented today for surgery, with the diagnosis of BILATERAL URETERAL STONES  The various methods of treatment have been discussed with the patient and family. After consideration of risks, benefits and other options for treatment, the patient has consented to  Procedure(s): RIGHT URETEROSCOPY/HOLMIUM LASER/STENT EXCHANGE (Right) LEFT NEPHROLITHOTOMY PERCUTANEOUS WITH SURGEON ACCESS (Left) as a surgical intervention .  The patient's history has been reviewed, patient examined, no change in status, stable for surgery.  I have reviewed the patient's chart and labs.  Questions were answered to the patient's satisfaction.     Berniece SalinesHERRICK, Sybrina Laning W

## 2016-06-02 NOTE — Progress Notes (Signed)
Dr. Krista BlueSinger present-

## 2016-06-02 NOTE — Anesthesia Procedure Notes (Signed)
Procedure Name: Intubation Date/Time: 06/02/2016 7:40 AM Performed by: Giavanni Zeitlin, Virgel Gess Pre-anesthesia Checklist: Patient identified, Emergency Drugs available, Suction available, Patient being monitored and Timeout performed Patient Re-evaluated:Patient Re-evaluated prior to inductionOxygen Delivery Method: Circle system utilized Preoxygenation: Pre-oxygenation with 100% oxygen Intubation Type: IV induction Ventilation: Mask ventilation without difficulty Laryngoscope Size: Mac and 4 Grade View: Grade II Tube type: Oral Tube size: 7.5 mm Number of attempts: 1 Airway Equipment and Method: Stylet Placement Confirmation: ETT inserted through vocal cords under direct vision,  positive ETCO2,  CO2 detector and breath sounds checked- equal and bilateral Secured at: 23 cm Tube secured with: Tape Dental Injury: Teeth and Oropharynx as per pre-operative assessment

## 2016-06-02 NOTE — Anesthesia Postprocedure Evaluation (Addendum)
Anesthesia Post Note  Patient: Lucas Clark  Procedure(s) Performed: Procedure(s) (LRB): RIGHT URETEROSCOPY/HOLMIUM LASER/BASKET EXTRACTION OF RIGHT URETERAL STONE/STENT EXCHANGE (Right) LEFT NEPHROLITHOTOMY PERCUTANEOUS WITH SURGEON ACCESS (Left)  Patient location during evaluation: PACU Anesthesia Type: General Level of consciousness: sedated Pain management: pain level controlled Vital Signs Assessment: post-procedure vital signs reviewed and stable Respiratory status: patient remains intubated per anesthesia plan and patient on ventilator - see flowsheet for VS Cardiovascular status: stable Anesthetic complications: yes Anesthetic complication details: required intubationComments: I updated pt.'s daughter that he required re-intubation and admission to the ICU.  She mentioned that his pulmonary status had deteriorated recently.  She voiced understanding of the situation (she is a Engineer, civil (consulting)nurse).  PACU staff Marcelino Duster(Michelle) accompanied daughter to the ICU for further instructions.       Last Vitals:  Vitals:   06/02/16 1459 06/02/16 1500  BP:  (!) 156/119  Pulse: 81 80  Resp: 19 (!) 24  Temp:      Last Pain:  Vitals:   06/02/16 1347  TempSrc: Axillary                 Lucas Clark

## 2016-06-02 NOTE — Discharge Instructions (Signed)
Discharge instructions following PCNL  Call your doctor for: Fevers greater than 100.5 Severe nausea or vomiting Increasing pain not controlled by pain medication Increasing redness or drainage from incisions Decreased urine output or a catheter is no longer draining  The number for questions is [phone].  Activity: Gradually increase activity with short frequent walks, 3-4 times a day.  Avoid strenuous activities, like sports, lawn-mowing, or heavy lifting (more than 10-15 pounds).  Wear loose, comfortable clothing that pull or kink the tube or tubes.  Do not drive while taking pain medication, or until your doctor permitts it.  Bathing and dressing changes: You should not shower for 48 hours after surgery.  Do not soak your back in a bathtub.  Diet: It is extremely important to drink plenty of fluids after surgery, especially water.  You may resume your regular diet, unless otherwise instructed.  Medications: May take Tylenol (acetaminophen) or ibuprofen (Advil, Motrin) as directed over-the-counter. Take any prescriptions as directed.  Follow-up appointments: Follow-up appointment will be scheduled with Dr. Keasha Malkiewicz in 10-14 days for hospital check and stent removal.  

## 2016-06-02 NOTE — Consult Note (Signed)
PULMONARY / CRITICAL CARE MEDICINE   Name: Lucas Clark MRN: [id number] DOB: 1950/03/02    ADMISSION DATE:  06/02/2016 CONSULTATION DATE:  06/02/16  REFERRING MD:  Dr. Krista Blue / Urology   CHIEF COMPLAINT:  Bilateral Flank Pain   HISTORY OF PRESENT ILLNESS:   66 y/o M with PMH of OSA, COPD, DM who presented to [facility] on 5/4 for planned right ureteroscopy, laser, stent exchange and left nephrolithotomy.   He was recently see on 4/16 in the [facility] ER for progressive flank pain, pelvic pain and difficulty voiding.  At that time, he had a CT scan which showed bilateral obstructing ureteral stones and urinalysis positive for WBC/RBC's and nitrites.  He was treated with 1 gram rocephin and urine cultures were collected.  He was taken to the OR for bilateral ureteral stent placement and discharged on 4/17 with plans for return assessment.    On 5/4, he underwent surgery as above and initially failed extubation.  He was reintubated post-operatively and returned to ICU.  Family reports he was wheezing prior to surgery this am.  He has not been evaluated by a Pulmonologist.  PCCM consulted for assistance with ICU care.     PAST MEDICAL HISTORY :  He  has a past medical history of Asthma; Blood transfusion (~ 1964); Bronchitis; COPD (chronic obstructive pulmonary disease) (HCC); High cholesterol; Hypertension; Restless legs; Sleep apnea; Stroke (HCC) (~2009); and Type II diabetes mellitus (HCC).  PAST SURGICAL HISTORY: He  has a past surgical history that includes Eye surgery (25 years ago); Pars plana vitrectomy (05/23/11); Total hip arthroplasty (08/10/2005); Total hip arthroplasty (11/2005); Pars plana vitrectomy (05/23/2011); Gas/fluid exchange (05/23/2011); and Cystoscopy with stent placement (Bilateral, 05/15/2016).  Allergies  Allergen Reactions  . Vancomycin Anaphylaxis, Hives and Other (See Comments)    Organs shut down, possibly due to vancomycin.  . Lisinopril Other (See Comments)    Possibly hives,  but taking Vancomycin at same time  . Rifampin     "was taking this along w/Vancomycin; not sure if reaction was result of this RX too"    No current facility-administered medications on file prior to encounter.    Current Outpatient Prescriptions on File Prior to Encounter  Medication Sig  . amLODipine (NORVASC) 5 MG tablet Take 5 mg by mouth daily.  . clonazePAM (KLONOPIN) 1 MG tablet Take 1 mg by mouth 3 (three) times daily as needed for anxiety.   Marland Kitchen losartan (COZAAR) 100 MG tablet Take 100 mg by mouth daily.  . pioglitazone (ACTOS) 15 MG tablet Take 15 mg by mouth daily.  . ropinirole (REQUIP) 5 MG tablet Take 15 mg by mouth at bedtime.  Marland Kitchen tiotropium (SPIRIVA) 18 MCG inhalation capsule Place 18 mcg into inhaler and inhale every evening.   . phenazopyridine (PYRIDIUM) 200 MG tablet Take 1 tablet (200 mg total) by mouth 3 (three) times daily as needed for pain. (Patient not taking: Reported on 05/26/2016)  . Trospium Chloride 60 MG CP24 Take 1 capsule (60 mg total) by mouth daily. (Patient not taking: Reported on 05/26/2016)    FAMILY HISTORY:  His has no family status information on file.    SOCIAL HISTORY: He  reports that he quit smoking about 24 years ago. His smoking use included Cigarettes. He has a 80.00 pack-year smoking history. He has never used smokeless tobacco. He reports that he drinks alcohol. He reports that he does not use drugs.  REVIEW OF SYSTEMS:  Unable to complete as patient is altered on  mechanical ventilation.   SUBJECTIVE:    VITAL SIGNS: BP (!) 156/119   Pulse 80   Temp 97.6 F (36.4 C) (Axillary)   Resp (!) 24   Ht 5\' 5"  (1.651 m)   Wt 290 lb (131.5 kg)   SpO2 100%   BMI 48.26 kg/m   HEMODYNAMICS:    VENTILATOR SETTINGS: Vent Mode: PRVC FiO2 (%):  [100 %] 100 % Set Rate:  [22 bmp] 22 bmp Vt Set:  [600 mL] 600 mL PEEP:  [5 cmH20] 5 cmH20 Plateau Pressure:  [32 cmH20] 32 cmH20  INTAKE / OUTPUT: No intake/output data recorded.  PHYSICAL  EXAMINATION: General: morbidly obese male on vent in NAD  HEENT: MM pink/moist, ETT, R eye dropping (chronic, hx of prior surgery), scleral edema Neuro:  Sedate, nods appropriately CV: s1s2 rrr, no m/r/g but difficult to hear heart tones PULM: even/non-labored, lungs bilaterally with wheezing GI: large abd, soft, non-tender, bsx4 active, left flank red rubber catheter and bloody drainage Extremities: warm/dry, no overt edema  Skin: no rashes or lesions   LABS:  BMET  Recent Labs Lab 05/31/16 0908  NA 140  K 4.8  CL 105  CO2 27  BUN 15  CREATININE 1.00  GLUCOSE 138*    Electrolytes  Recent Labs Lab 05/31/16 0908  CALCIUM 8.7*    CBC  Recent Labs Lab 05/31/16 0908  WBC 10.6*  HGB 12.7*  HCT 39.6  PLT 206    Coag's No results for input(s): APTT, INR in the last 168 hours.  Sepsis Markers No results for input(s): LATICACIDVEN, PROCALCITON, O2SATVEN in the last 168 hours.  ABG No results for input(s): PHART, PCO2ART, PO2ART in the last 168 hours.  Liver Enzymes No results for input(s): AST, ALT, ALKPHOS, BILITOT, ALBUMIN in the last 168 hours.  Cardiac Enzymes No results for input(s): TROPONINI, PROBNP in the last 168 hours.  Glucose  Recent Labs Lab 05/31/16 0815 06/02/16 0617 06/02/16 1422  GLUCAP 141* 110* 191*    Imaging Dg Chest Port 1 View  Result Date: 06/02/2016 CLINICAL DATA:  Status post endotracheal tube placement. EXAM: PORTABLE CHEST 1 VIEW COMPARISON:  CT chest 05/15/2016. FINDINGS: Endotracheal tube is in place with the tip in good position 4.2 cm above the carina. NG tube courses into the stomach and below the inferior margin of the film. Bibasilar airspace disease is worse on the right. No pneumothorax or pleural fluid. Aortic atherosclerosis is noted. Heart size is normal. IMPRESSION: ETT and NG tube in good position. Mild bibasilar airspace disease is more notable on the left likely due to atelectasis. Electronically Signed   By:  Drusilla Kannerhomas  Dalessio M.D.   On: 06/02/2016 14:34   Dg C-arm Gt 120 Min-no Report  Result Date: 06/02/2016 Fluoroscopy was utilized by the requesting physician.  No radiographic interpretation.     STUDIES:  CT Chest 4/16 >> LLL pulmonary nodule, suspect hamartoma. Esophageal dilation suggestive of dysmotility or GERD  CULTURES: UC 5/2 >> negative   ANTIBIOTICS: Ancef 5/4 x1 Gentamycin 5/4 x1 Cipro 5/4 >>   SIGNIFICANT EVENTS: 5/04  Admit for planned stent exchange   LINES/TUBES: ETT 5/4 >>   DISCUSSION: 66 y/o M admitted on 5/4 for planned right ureteral stent exchange in the setting of known bilateral renal calculi.  Returned post-operatively to ICU on vent.   ASSESSMENT / PLAN:  PULMONARY A: Acute Respiratory Insufficiency - post op, possible airway edema (prone for 4 hour surgery), failed extubation LLL Pulmonary Nodule COPD  OSA -  not compliant with CPAP P:   PRVC 8 cc/kg  Wean PEEP / FiO2 for sats 90-95% Duonebs Q6 + Albuterol Q3 PRN  CXR post intubation  ABG one hour post arrival  Will need PFT's as outpatient to define COPD (primary care in [place], followed at [place] as well) Hold home spiriva  CARDIOVASCULAR A:  Hx HTN, HLD, CVA P:  ICU monitoring of hemodynamics  Assess EKG post surgery  Continue norvasc, cozaar, pravachol  PRN hydralazine   RENAL A:   Right Ureterscopy with Stent Exchange  AKI -  P:   Urology following, appreciate input  Trend BMP / urinary output Replace electrolytes as indicated Avoid nephrotoxic agents, ensure adequate renal perfusion LR at [facility]  GASTROINTESTINAL A:   Morbid Obesity  P:   NPO OGT  PPI while intubated Begin TF in am if remains intubated   HEMATOLOGIC A:   Mild Anemia  P:  Trend CBC  Monitor for bleeding from L flank catheter   INFECTIOUS A:   Bilateral Renal Stones s/p L Nephrolithotomy & R Stent Replacement  P:   Cultures / ABX as above  Monitor fever curve / WBC   ENDOCRINE A:   DM     P:   Assess Hgb A1c  SSI Q4 with resistant scale  Lantus 20 units QD   NEUROLOGIC A:   Hx RLS, CVA P:   RASS goal: 0 to -1 Reduce Requip to 1mg  and verify dosing (was ordered as 15 mg QHS) PRN versed / fentanyl for sedation / pain  Daily WUA / SBT    FAMILY  - Updates:  Daughter Lucas Clark, who is an Engineer, civil (consulting)) updated at bedside  - Inter-disciplinary family meet or Palliative Care meeting due by:  5/11   CC Time: 35 minutes  Canary Brim, NP-C [facility] Pgr: [phone] or if no answer [phone] 06/02/2016, 3:16 PM  Attending Note:  I have examined patient, reviewed labs, studies and notes. I have discussed the case with B Ollis, and I agree with the data and plans as amended above. 66 year old man with obesity, obstructive sleep apnea (not on CPAP), diabetes who is postop day 0 status post a right ureteroscopy, laser, stent exchange and left nephrolithotomy. Per Dr. Robina Ade report the procedure required him to be in a Trendelenburg position for several hours. In the recovery area he failed extubation due to hypersomnolence and upper airway obstruction. He was also noted to have some generalized wheezing. He was successfully reintubated and will go to the ICU for further care, vent weaning. On my evaluation he is obese, intubated, appears comfortable. He is awake and will interact, he follows commands. Lungs are distant with some soft end expiratory wheezing bilaterally, bi-basilar inspiratory crackles. Abdomen is obese soft with positive bowel sounds. Heart regular without a murmur. He has a left percutaneous ureteral drain. Given his wheezing and also his reported upper airway edema on reintubation we will treat with a brief course of steroids. Continue bronchodilators. Minimize sedating medications in preparation for spontaneous breathing trial and hopeful extubation on 06/03/16. He was treated with Ancef and gentamicin perioperatively and will be continued on  ciprofloxacin per urology plans. Cultures are negative so far. We will decrease his Requip to 1 mg and confirm that his true dose is actually 15 mg which seems high. Cover him with Lantus 20 units and sliding scale insulin. Hold home metformin, Actos, glipizide. Check hemoglobin A1c. Independent critical care time is 40 minutes.   Molly Maduro  Delton Coombes, MD, PhD 06/02/2016, 4:34 PM [facility] [phone] or if no answer [phone]

## 2016-06-03 ENCOUNTER — Inpatient Hospital Stay (HOSPITAL_COMMUNITY): Payer: Medicare HMO

## 2016-06-03 DIAGNOSIS — J441 Chronic obstructive pulmonary disease with (acute) exacerbation: Secondary | ICD-10-CM

## 2016-06-03 DIAGNOSIS — J95821 Acute postprocedural respiratory failure: Secondary | ICD-10-CM

## 2016-06-03 LAB — GLUCOSE, CAPILLARY
GLUCOSE-CAPILLARY: 152 mg/dL — AB (ref 65–99)
GLUCOSE-CAPILLARY: 176 mg/dL — AB (ref 65–99)
Glucose-Capillary: 147 mg/dL — ABNORMAL HIGH (ref 65–99)
Glucose-Capillary: 155 mg/dL — ABNORMAL HIGH (ref 65–99)
Glucose-Capillary: 169 mg/dL — ABNORMAL HIGH (ref 65–99)
Glucose-Capillary: 170 mg/dL — ABNORMAL HIGH (ref 65–99)
Glucose-Capillary: 175 mg/dL — ABNORMAL HIGH (ref 65–99)

## 2016-06-03 LAB — CBC
HCT: 29.8 % — ABNORMAL LOW (ref 39.0–52.0)
Hemoglobin: 9.8 g/dL — ABNORMAL LOW (ref 13.0–17.0)
MCH: 31.6 pg (ref 26.0–34.0)
MCHC: 32.9 g/dL (ref 30.0–36.0)
MCV: 96.1 fL (ref 78.0–100.0)
PLATELETS: 176 10*3/uL (ref 150–400)
RBC: 3.1 MIL/uL — AB (ref 4.22–5.81)
RDW: 15.3 % (ref 11.5–15.5)
WBC: 11.7 10*3/uL — ABNORMAL HIGH (ref 4.0–10.5)

## 2016-06-03 LAB — BASIC METABOLIC PANEL
Anion gap: 9 (ref 5–15)
BUN: 20 mg/dL (ref 6–20)
CALCIUM: 7.4 mg/dL — AB (ref 8.9–10.3)
CO2: 24 mmol/L (ref 22–32)
CREATININE: 1.43 mg/dL — AB (ref 0.61–1.24)
Chloride: 105 mmol/L (ref 101–111)
GFR calc non Af Amer: 49 mL/min — ABNORMAL LOW (ref >60)
GFR, EST AFRICAN AMERICAN: 57 mL/min — AB (ref >60)
Glucose, Bld: 190 mg/dL — ABNORMAL HIGH (ref 65–99)
Potassium: 5 mmol/L (ref 3.5–5.1)
SODIUM: 138 mmol/L (ref 135–145)

## 2016-06-03 LAB — HEMOGLOBIN A1C
HEMOGLOBIN A1C: 7.1 % — AB (ref 4.8–5.6)
Mean Plasma Glucose: 157 mg/dL

## 2016-06-03 MED ORDER — METHYLPREDNISOLONE SODIUM SUCC 40 MG IJ SOLR
40.0000 mg | INTRAMUSCULAR | Status: DC
  Administered 2016-06-04: 40 mg via INTRAVENOUS
  Filled 2016-06-03: qty 1

## 2016-06-03 MED ORDER — PANTOPRAZOLE SODIUM 40 MG PO TBEC
40.0000 mg | DELAYED_RELEASE_TABLET | Freq: Every day | ORAL | Status: DC
  Administered 2016-06-03 – 2016-06-04 (×2): 40 mg via ORAL
  Filled 2016-06-03 (×2): qty 1

## 2016-06-03 NOTE — Progress Notes (Signed)
Pt has declined CPAP trial tonight.  Pt education provided but pt still refuses, RT to monitor and assess as needed.

## 2016-06-03 NOTE — Progress Notes (Signed)
1 Day Post-Op  Assessment: 1. Status post right ureteroscopy and left PCNL: Had a large stone burden on both sides requiring prolonged anesthesia. His stone on the right side was adequately treated and it was felt that his left renal calculus was adequately treated as well. He has a nephrostomy tube and a Foley catheter indwelling. He will undergo a CT scan to evaluate him for remaining stone. I will remove his nephrostomy tube first and as long as he is doing well then remove his Foley catheter.  2. Prolonged intubation: He has a difficult airway and required prolonged anesthesia and eventually had to be reintubated postoperatively. Her plan is to perform a vent wean today.  Plan: 1. Vent wean today.  2. I will clamp his nephrostomy tube this morning. 3. As long as he does well with his nephrostomy tube clamped it will be removed. 4. He is scheduled to undergo CT scan to follow up for remaining stone fragments. 5. I will plan to remove his Foley catheter later today as long as he is doing well.    Subjective: Patient is unable to converse as he is on the ventilator but is appropriate and is able to understand the plan set forth today.  Objective: Vital signs in last 24 hours: Temp:  [97.6 F (36.4 C)-98.8 F (37.1 C)] 98.2 F (36.8 C) (05/05 0519) Pulse Rate:  [73-87] 78 (05/05 0750) Resp:  [4-28] 22 (05/05 0750) BP: (96-163)/(34-119) 99/36 (05/05 0600) SpO2:  [65 %-100 %] 97 % (05/05 0750) FiO2 (%):  [40 %-100 %] 40 % (05/05 0750) Weight:  [303 lb 12.7 oz (137.8 kg)] 303 lb 12.7 oz (137.8 kg) (05/04 1532)  Intake/Output from previous day: 05/04 0701 - 05/05 0700 In: 4576.3 [I.V.:4276.3; IV Piggyback:300] Out: 2475 [Urine:1960; Emesis/NG output:215; Blood:300] Intake/Output this shift: No intake/output data recorded.  Past Medical History:  Diagnosis Date  . Asthma    "a touch"  . Blood transfusion ~ 1964  . Bronchitis    "a touch"  . COPD (chronic obstructive pulmonary  disease) (HCC)   . High cholesterol   . Hypertension   . Restless legs   . Sleep apnea    last sleep study 2005. does not use cpap  . Stroke [facility]) ~2009   denies residual droopy r eye lid  . Type II diabetes mellitus (HCC)    Current Facility-Administered Medications  Medication Dose Route Frequency Provider Last Rate Last Dose  . 0.9 %  sodium chloride infusion   Intravenous Continuous Crist Fat, MD 125 mL/hr at 06/03/16 0133    . acetaminophen (OFIRMEV) IV 1,000 mg  1,000 mg Intravenous Q6H Crist Fat, MD 400 mL/hr at 06/03/16 0622 1,000 mg at 06/03/16 0622  . albuterol (PROVENTIL) (2.5 MG/3ML) 0.083% nebulizer solution 2.5 mg  2.5 mg Nebulization Q3H PRN Ollis, Brandi L, NP      . amLODipine (NORVASC) tablet 5 mg  5 mg Per Tube Daily Ollis, Brandi L, NP      . chlorhexidine gluconate (MEDLINE KIT) (PERIDEX) 0.12 % solution 15 mL  15 mL Mouth Rinse BID Ollis, Brandi L, NP   15 mL at 06/03/16 0744  . ciprofloxacin (CIPRO) tablet 500 mg  500 mg Per Tube BID Veleta Miners, Brandi L, NP   500 mg at 06/03/16 0744  . clonazePAM (KLONOPIN) tablet 1 mg  1 mg Oral TID PRN Crist Fat, MD      . fentaNYL (SUBLIMAZE) injection 50 mcg  50 mcg Intravenous Q15  min PRN Donita Brooks, NP   50 mcg at 06/02/16 1638  . fentaNYL (SUBLIMAZE) injection 50 mcg  50 mcg Intravenous Q2H PRN Noe Gens L, NP   50 mcg at 06/02/16 1803  . hydrALAZINE (APRESOLINE) injection 5 mg  5 mg Intravenous Q4H PRN Ardis Hughs, MD      . insulin aspart (novoLOG) injection 0-20 Units  0-20 Units Subcutaneous Q4H Ollis, Brandi L, NP   4 Units at 06/03/16 0459  . insulin glargine (LANTUS) injection 20 Units  20 Units Subcutaneous Daily Ardis Hughs, MD      . ipratropium-albuterol (DUONEB) 0.5-2.5 (3) MG/3ML nebulizer solution 3 mL  3 mL Nebulization Q6H Ollis, Brandi L, NP   3 mL at 06/03/16 0750  . loperamide (IMODIUM) capsule 6 mg  6 mg Oral BID PRN Ollis, Brandi L, NP      . losartan  (COZAAR) tablet 100 mg  100 mg Per Tube Daily Ollis, Brandi L, NP      . MEDLINE mouth rinse  15 mL Mouth Rinse QID Ollis, Brandi L, NP   15 mL at 06/03/16 0459  . methylPREDNISolone sodium succinate (SOLU-MEDROL) 40 mg/mL injection 40 mg  40 mg Intravenous Q12H Ollis, Brandi L, NP   40 mg at 06/03/16 0535  . midazolam (VERSED) injection 1 mg  1 mg Intravenous Q15 min PRN Ollis, Brandi L, NP      . midazolam (VERSED) injection 1 mg  1 mg Intravenous Q2H PRN Ollis, Brandi L, NP      . opium-belladonna (B&O SUPPRETTES) 16.2-60 MG suppository 1 suppository  1 suppository Rectal Q8H PRN Ardis Hughs, MD      . pantoprazole sodium (PROTONIX) 40 mg/20 mL oral suspension 40 mg  40 mg Per Tube Q1200 Ollis, Brandi L, NP      . pravastatin (PRAVACHOL) tablet 40 mg  40 mg Per Tube QPM Ollis, Brandi L, NP   40 mg at 06/02/16 1743  . propofol (DIPRIVAN) 1000 MG/100ML infusion  5-70 mcg/kg/min Intravenous Titrated Anders Simmonds, MD 12.4 mL/hr at 06/03/16 0117 15 mcg/kg/min at 06/03/16 0117  . rOPINIRole (REQUIP) tablet 1 mg  1 mg Per Tube QHS Noe Gens L, NP   1 mg at 06/02/16 2124    Physical Exam:  General: Patient is in no apparent distress. He remains intubated. Lungs: Normal respiratory effort, chest expands symmetrically on the ventilator. GI: The abdomen is soft, obese and nontender without mass. GU: Foley catheter is draining and urine is slightly bloody with no clots.   Lab Results:  Recent Labs  05/31/16 0908 06/02/16 1904 06/03/16 0316  WBC 10.6* 14.4* 11.7*  HGB 12.7* 10.6* 9.8*  HCT 39.6 34.5* 29.8*   BMET  Recent Labs  06/02/16 1904 06/03/16 0316  NA 138 138  K 5.2* 5.0  CL 104 105  CO2 27 24  GLUCOSE 165* 190*  BUN 19 20  CREATININE 1.75* 1.43*  CALCIUM 7.6* 7.4*   No results for input(s): LABPT, INR in the last 72 hours. No results for input(s): LABURIN in the last 72 hours. Results for orders placed or performed during the hospital encounter of 06/02/16   MRSA PCR Screening     Status: None   Collection Time: 06/02/16  3:37 PM  Result Value Ref Range Status   MRSA by PCR NEGATIVE NEGATIVE Final    Comment:        The GeneXpert MRSA Assay (FDA approved for NASAL specimens only), is  one component of a comprehensive MRSA colonization surveillance program. It is not intended to diagnose MRSA infection nor to guide or monitor treatment for MRSA infections.     Studies/Results: Dg Abd 1 View  Result Date: 06/02/2016 CLINICAL DATA:  Intubation. EXAM: ABDOMEN - 1 VIEW COMPARISON:  CT 05/14/2016. FINDINGS: NG tube tip noted projected over the upper portion of the stomach. Left nephrostomy tube projected over lower portion left kidney. Bilateral double-J ureteral stents appear to be in good anatomic position. No bowel distention. Respiratory motion noted . IMPRESSION: 1.  NG tube noted with its tip projected over the upper stomach. 2. Left nephrostomy tube projected over left kidney. Bilateral double-J ureteral stents appear to be in good anatomic position . No acute abnormality identified. Electronically Signed   By: Marcello Moores  Register   On: 06/02/2016 15:59   Dg Chest Port 1 View  Result Date: 06/02/2016 CLINICAL DATA:  Status post endotracheal tube placement. EXAM: PORTABLE CHEST 1 VIEW COMPARISON:  CT chest 05/15/2016. FINDINGS: Endotracheal tube is in place with the tip in good position 4.2 cm above the carina. NG tube courses into the stomach and below the inferior margin of the film. Bibasilar airspace disease is worse on the right. No pneumothorax or pleural fluid. Aortic atherosclerosis is noted. Heart size is normal. IMPRESSION: ETT and NG tube in good position. Mild bibasilar airspace disease is more notable on the left likely due to atelectasis. Electronically Signed   By: Inge Rise M.D.   On: 06/02/2016 14:34   Dg C-arm Gt 120 Min-no Report  Result Date: 06/02/2016 Fluoroscopy was utilized by the requesting physician.  No  radiographic interpretation.   Dg C-arm Gt 120 Min-no Report  Result Date: 06/02/2016 Fluoroscopy was utilized by the requesting physician.  No radiographic interpretation.       Puneet Masoner C 06/03/2016, 8:03 AM

## 2016-06-03 NOTE — Procedures (Signed)
Extubation Procedure Note  Patient Details:   Name: Lucas Clark DOB: [birth year] MRN: [id number]   Airway Documentation:     Evaluation  O2 sats: stable throughout Complications: No apparent complications Patient did tolerate procedure well. Bilateral Breath Sounds: Clear   Yes    Patient extubated to 3 L [place]. Cuff leak was present. Patient in no distress and no stridor noted. RT will continue to monitor patient.   Dannielle KarvonenMegan K Cleva [place] 06/03/2016, 9:44 AM

## 2016-06-03 NOTE — Progress Notes (Addendum)
PULMONARY / CRITICAL CARE MEDICINE   Name: Lucas Clark MRN: [id number] DOB: 1950-12-07    ADMISSION DATE:  06/02/2016 CONSULTATION DATE:  06/02/16  REFERRING MD:  Dr. Krista BlueSinger / Urology   CHIEF COMPLAINT:  Bilateral Flank Pain   HISTORY OF PRESENT ILLNESS:   66 y/o M with PMH of OSA, COPD, DM who underwent  planned right ureteroscopy, laser, stent exchange and left nephrolithotomy on 5/4. Large stone burden requiring prolonged anesthesia He was reintubated post-operatively and returned to ICU.  He was recently [facility] for progressive flank pain, pelvic pain and difficulty voiding.  At that time, he had a CT scan which showed bilateral obstructing ureteral stones and urinalysis positive for WBC/RBC's and nitrites.  He was treated with 1 gram rocephin and urine cultures were collected.  He was taken to the OR for bilateral ureteral stent placement and discharged on 4/17 .     SUBJECTIVE:  Intubated Good UO  VITAL SIGNS: BP (!) 122/49   Pulse 90   Temp 98.2 F (36.8 C) (Axillary)   Resp 18   Ht 5\' 4"  (1.626 m)   Wt (!) 303 lb 12.7 oz (137.8 kg)   SpO2 94%   BMI 52.15 kg/m   HEMODYNAMICS:    VENTILATOR SETTINGS: Vent Mode: PRVC FiO2 (%):  [40 %-100 %] 40 % Set Rate:  [20 bmp-22 bmp] 20 bmp Vt Set:  [600 mL] 600 mL PEEP:  [5 cmH20] 5 cmH20 Plateau Pressure:  [16 cmH20-32 cmH20] 16 cmH20  INTAKE / OUTPUT: I/O last 3 completed shifts: In: 4701.3 [I.V.:4401.3; IV Piggyback:300] Out: 2475 [Urine:1960; Emesis/NG output:215; Blood:300]  PHYSICAL EXAMINATION: General: morbidly obese male on vent in NAD  HEENT: MM pink/moist, ETT, R ptosis(chronic, hx of prior surgery), scleral edema Neuro:  Sedate, nods appropriately CV: s1s2 rrr, no m/r/g , distant  PULM: even/non-labored, lungs BL decreased GI: large abd, soft, non-tender, bsx4 active, left flank red rubber catheter and bloody urine Extremities: warm/dry, no overt edema  Skin: no rashes or  lesions   LABS:  BMET  Recent Labs Lab 06/02/16 1451 06/02/16 1904 06/03/16 0316  NA 139 138 138  K 6.0* 5.2* 5.0  CL 104 104 105  CO2 27 27 24   BUN 18 19 20   CREATININE 1.74* 1.75* 1.43*  GLUCOSE 193* 165* 190*    Electrolytes  Recent Labs Lab 06/02/16 1451 06/02/16 1904 06/03/16 0316  CALCIUM 7.8* 7.6* 7.4*    CBC  Recent Labs Lab 05/31/16 0908 06/02/16 1904 06/03/16 0316  WBC 10.6* 14.4* 11.7*  HGB 12.7* 10.6* 9.8*  HCT 39.6 34.5* 29.8*  PLT 206 200 176    Coag's No results for input(s): APTT, INR in the last 168 hours.  Sepsis Markers No results for input(s): LATICACIDVEN, PROCALCITON, O2SATVEN in the last 168 hours.  ABG  Recent Labs Lab 06/02/16 1730  PHART 7.268*  PCO2ART 56.2*  PO2ART 219*    Liver Enzymes  Recent Labs Lab 06/02/16 1904  AST 35  ALT 20  ALKPHOS 78  BILITOT 0.3  ALBUMIN 2.9*    Cardiac Enzymes No results for input(s): TROPONINI, PROBNP in the last 168 hours.  Glucose  Recent Labs Lab 06/02/16 1422 06/02/16 1641 06/02/16 1952 06/03/16 0003 06/03/16 0429 06/03/16 0851  GLUCAP 191* 178* 154* 176* 169* 175*    Imaging Dg Abd 1 View  Result Date: 06/02/2016 CLINICAL DATA:  Intubation. EXAM: ABDOMEN - 1 VIEW COMPARISON:  CT 05/14/2016. FINDINGS: NG tube tip  noted projected over the upper portion of the stomach. Left nephrostomy tube projected over lower portion left kidney. Bilateral double-J ureteral stents appear to be in good anatomic position. No bowel distention. Respiratory motion noted . IMPRESSION: 1.  NG tube noted with its tip projected over the upper stomach. 2. Left nephrostomy tube projected over left kidney. Bilateral double-J ureteral stents appear to be in good anatomic position . No acute abnormality identified. Electronically Signed   By: Maisie Fus  Register   On: 06/02/2016 15:59   Dg Chest Port 1 View  Result Date: 06/03/2016 CLINICAL DATA:  66 year old male with respiratory failure. EXAM:  PORTABLE CHEST 1 VIEW COMPARISON:  06/02/2016 and earlier. FINDINGS: Portable AP supine view at 0514 hours. Stable endotracheal tube tip between the clavicles and carina. Enteric tube courses to the abdomen, side hole to level of the gastric fundus. Mildly lower lung volumes. Mildly increased streaky retrocardiac opacity. Continued widespread bilateral interstitial opacity. No pneumothorax. No pleural effusion is evident. Calcified aortic atherosclerosis. And negative visible bowel gas pattern. IMPRESSION: 1.  Stable lines and tubes. 2. Mildly lower lung volumes. Continued bilateral widespread interstitial opacity and interval increased streaky left lung base opacity. Favor acute viral/atypical respiratory infection. 3. No definite pleural effusion. Electronically Signed   By: Odessa Fleming M.D.   On: 06/03/2016 08:07   Dg Chest Port 1 View  Result Date: 06/02/2016 CLINICAL DATA:  Status post endotracheal tube placement. EXAM: PORTABLE CHEST 1 VIEW COMPARISON:  CT chest 05/15/2016. FINDINGS: Endotracheal tube is in place with the tip in good position 4.2 cm above the carina. NG tube courses into the stomach and below the inferior margin of the film. Bibasilar airspace disease is worse on the right. No pneumothorax or pleural fluid. Aortic atherosclerosis is noted. Heart size is normal. IMPRESSION: ETT and NG tube in good position. Mild bibasilar airspace disease is more notable on the left likely due to atelectasis. Electronically Signed   By: Drusilla Kanner M.D.   On: 06/02/2016 14:34   Dg C-arm Gt 120 Min-no Report  Result Date: 06/02/2016 Fluoroscopy was utilized by the requesting physician.  No radiographic interpretation.   Dg C-arm Gt 120 Min-no Report  Result Date: 06/02/2016 Fluoroscopy was utilized by the requesting physician.  No radiographic interpretation.     STUDIES:  CT Chest 4/16 >> LLL pulmonary nodule, suspect hamartoma. Esophageal dilation suggestive of dysmotility or  GERD  CULTURES: UC 5/2 >> negative   ANTIBIOTICS: Ancef 5/4 x1 Gentamycin 5/4 x1 Cipro 5/4 >>   SIGNIFICANT EVENTS: 5/04  Admit for planned stent exchange   LINES/TUBES: ETT 5/4 >>   DISCUSSION: 66 y/o M admitted on 5/4 for planned right ureteral stent exchange in the setting of known bilateral renal calculi.  Returned post-operatively to ICU on vent.   ASSESSMENT / PLAN:  PULMONARY A: Acute Respiratory Insufficiency - post op, possible airway edema (prone for 4 hour surgery), failed extubation LLL Pulmonary Nodule COPD exacerbation OSA - not compliant with CPAP P:   SBts , tolerating PS 5/5 , no cuff leak asssessed - proceed with extubation Duonebs Q6 + Albuterol Q3 PRN  Drop IV solumedrol 40 daily & stop in 24h Will need PFT's as outpatient to define COPD (primary care in [place], followed at [place] as well) Hold home spiriva CPAP q hs  CARDIOVASCULAR A:  Hx HTN, HLD, CVA P:  Continue norvasc, pravachol  PRN hydralazine  Hold losartan  RENAL A:   Right Ureterscopy with Stent Exchange  AKI -  P:   Urology following, appreciate input  Trend BMP / urinary output Replace electrolytes as indicated Avoid nephrotoxic agents, ensure adequate renal perfusion LR at [facility]  GASTROINTESTINAL A:   Morbid Obesity  P:   NPO OGT  PPI while intubated   HEMATOLOGIC A:   Mild Anemia  P:  Trend CBC  Monitor for bleeding from L flank catheter   INFECTIOUS A:   Bilateral Renal Stones s/p L Nephrolithotomy & R Stent Replacement  P:   Cultures / ABX as above  Monitor fever curve / WBC   ENDOCRINE A:   DM    P:   Assess Hgb A1c  SSI Q4 with resistant scale  Lantus 20 units QD   NEUROLOGIC A:   Hx RLS, CVA P:   RASS goal: 0  Reduce Requip to 1mg  and verify dosing (was ordered as 15 mg QHS)    FAMILY  - Updates:  Daughter Jill Side, who is an Engineer, civil (consulting)) updated at bedside  - Inter-disciplinary family meet or Palliative Care meeting due by:NA   CC Time:  32 minutes   06/03/2016, 9:39 AM

## 2016-06-04 ENCOUNTER — Inpatient Hospital Stay (HOSPITAL_COMMUNITY): Payer: Medicare HMO

## 2016-06-04 LAB — GLUCOSE, CAPILLARY
GLUCOSE-CAPILLARY: 141 mg/dL — AB (ref 65–99)
Glucose-Capillary: 113 mg/dL — ABNORMAL HIGH (ref 65–99)

## 2016-06-04 LAB — BASIC METABOLIC PANEL
Anion gap: 5 (ref 5–15)
BUN: 22 mg/dL — ABNORMAL HIGH (ref 6–20)
CALCIUM: 7.8 mg/dL — AB (ref 8.9–10.3)
CO2: 30 mmol/L (ref 22–32)
CREATININE: 1.17 mg/dL (ref 0.61–1.24)
Chloride: 108 mmol/L (ref 101–111)
GFR calc non Af Amer: >60 mL/min (ref >60)
Glucose, Bld: 125 mg/dL — ABNORMAL HIGH (ref 65–99)
Potassium: 4.6 mmol/L (ref 3.5–5.1)
Sodium: 143 mmol/L (ref 135–145)

## 2016-06-04 LAB — CBC
HEMATOCRIT: 27.4 % — AB (ref 39.0–52.0)
Hemoglobin: 9 g/dL — ABNORMAL LOW (ref 13.0–17.0)
MCH: 31.7 pg (ref 26.0–34.0)
MCHC: 32.8 g/dL (ref 30.0–36.0)
MCV: 96.5 fL (ref 78.0–100.0)
Platelets: 178 10*3/uL (ref 150–400)
RBC: 2.84 MIL/uL — ABNORMAL LOW (ref 4.22–5.81)
RDW: 15.4 % (ref 11.5–15.5)
WBC: 10 10*3/uL (ref 4.0–10.5)

## 2016-06-04 LAB — MAGNESIUM: Magnesium: 2.3 mg/dL (ref 1.7–2.4)

## 2016-06-04 LAB — PHOSPHORUS: Phosphorus: 3.3 mg/dL (ref 2.5–4.6)

## 2016-06-04 MED ORDER — OXYCODONE-ACETAMINOPHEN 5-325 MG PO TABS
2.0000 | ORAL_TABLET | Freq: Four times a day (QID) | ORAL | Status: DC | PRN
  Administered 2016-06-04: 2 via ORAL
  Filled 2016-06-04: qty 2

## 2016-06-04 MED ORDER — OXYCODONE-ACETAMINOPHEN 5-325 MG PO TABS: 2.0000 | ORAL_TABLET | ORAL | Status: DC | PRN

## 2016-06-04 NOTE — Progress Notes (Signed)
Reviewed discharge instructions with patient. Called Dr. Vernie Ammonsttelin about Ultram order, and the patient has Oxycodone 5 mg po at home.  It is ok to use that when he gets home for pain.  Changed the medication ordered on the discharge instructions.  Patient understands discharge instructions and when he is suppose to go see his urologist on May 18th.  IV in right hand removed intact without problem.  Nabeeha Badertscher Hipolito BayleyArnold RN, BSN

## 2016-06-04 NOTE — Discharge Summary (Signed)
Physician Discharge Summary  Patient ID: Lucas HeckDavid E Gigante MRN: [id number] DOB/AGE: 07/25/1950 66 y.o.  Admit date: 06/02/2016 Discharge date: 06/04/2016  Admission Diagnoses: H/O nephrolithotomy with removal of calculi [Z98.890, Z87.442]  Discharge Diagnoses:  Active Problems:   Nephrolithiasis   H/O nephrolithotomy with removal of calculi   Discharged Condition: good  Hospital Course: He was electively admitted for surgical management of a right ureteral stone and a left renal calculus. He underwent right ureteroscopy and laser lithotripsy of his right ureteral stone and then a left percutaneous nephrolithotomy. The procedures proceeded without complication however due to the complex nature and stone burden overall the surgery required a great deal of time. He was having hypoxia in the recovery room after being extubated and therefore required a reintubation. He was observed overnight intubated and the following morning underwent a successful vent wean. He had been having very little output from his nephrostomy tube and what was clamped there was no significant increase in left flank pain and therefore his nephrostomy was removed. He continued to do well and by the following morning had no significant difficulties overnight. He was being bothered slightly by the stents and was having some mild flank pain but his urine was clear and therefore his catheter was removed. He underwent a CT scan that revealed excellent clearance of stones on both sides with only a single minute calcification remaining within the kidney on the right-hand side. At this time he is tolerating regular diet, his pain is controlled and there is no sign of infection and therefore he is felt ready for discharge. He will follow up as an outpatient for stent removal.   Significant Diagnostic Studies: Ct Abdomen Pelvis Wo Contrast  Result Date: 06/03/2016 CLINICAL DATA:  Status post lithotripsy. Evaluate for residual stones. EXAM:  CT ABDOMEN AND PELVIS WITHOUT CONTRAST TECHNIQUE: Multidetector CT imaging of the abdomen and pelvis was performed following the standard protocol without IV contrast. COMPARISON:  05/14/2016 FINDINGS: Lower chest: Motion degradation at the lung bases. Bibasilar volume loss. A left lower lobe pulmonary nodule was detailed on the prior exam, including at 11 mm. Mild cardiomegaly, incompletely imaged. Small hiatal hernia. Trace bilateral pleural thickening. Hepatobiliary: Moderate hepatic steatosis and hepatomegaly, approximately 19 cm. The hepatic dome is partially excluded. Anterior caudate lobe low-density lesion is likely a cyst. Gallstones in the neck measure approximately 1.0 cm. No acute cholecystitis or biliary duct dilatation. Pancreas: Fatty atrophy involving the pancreas, especially the head and uncinate process. Spleen: Normal in size, without focal abnormality. Adrenals/Urinary Tract: Normal adrenal glands. Bilateral low-density renal lesions are too small to characterize but likely cysts. Placement of bilateral ureteric stents . Left renal calcification on image 29/series 2 is favored to be vascular. No residual renal collecting system stones. Trace right renal collecting system air is likely iatrogenic. Degraded evaluation of the pelvis, secondary to beam hardening artifact from bilateral hip arthroplasty. Both ureteric stents terminate in the urinary bladder. No bladder or ureteric stones identified. Left greater than right perinephric edema persists. Foley catheter. Stomach/Bowel: Normal remainder of the stomach. Normal colon and terminal ileum. Normal small bowel. Vascular/Lymphatic: Advanced aortic and branch vessel atherosclerosis. Borderline left external iliac adenopathy at 1.0 cm, similar. Reproductive: Prostate not well evaluated; probable prostatic calcification adjacent the tract of a Foley, eccentric right, on image 77/series 2. This is unchanged. Other: No significant free fluid.  Musculoskeletal: Bilateral hip arthroplasty. IMPRESSION: 1. Placement of bilateral ureteric stents. No evidence of residual urinary tract calculi or hydronephrosis. 2. Degraded  evaluation of the pelvis, secondary to beam hardening artifact from bilateral hip arthroplasty. 3. Calcification along the right side of the penile urethra is favored to be prostatic parenchymal and is similar to on the prior exam and present back to 04/08/2010. 4. Hepatic steatosis and hepatomegaly. 5. Cholelithiasis. 6.  Aortic atherosclerosis. 7. Left lower lobe pulmonary nodule as detailed on the prior exam. This is present back on 04/08/2010, favoring a benign etiology. 8. Borderline pelvic adenopathy is not similar back to 2012 and is likely reactive. Electronically Signed   By: Jeronimo Greaves M.D.   On: 06/03/2016 14:54   Dg Abd 1 View  Result Date: 06/02/2016 CLINICAL DATA:  Intubation. EXAM: ABDOMEN - 1 VIEW COMPARISON:  CT 05/14/2016. FINDINGS: NG tube tip noted projected over the upper portion of the stomach. Left nephrostomy tube projected over lower portion left kidney. Bilateral double-J ureteral stents appear to be in good anatomic position. No bowel distention. Respiratory motion noted . IMPRESSION: 1.  NG tube noted with its tip projected over the upper stomach. 2. Left nephrostomy tube projected over left kidney. Bilateral double-J ureteral stents appear to be in good anatomic position . No acute abnormality identified. Electronically Signed   By: Maisie Fus  Register   On: 06/02/2016 15:59   Dg Chest Port 1 View  Result Date: 06/04/2016 CLINICAL DATA:  Acute respiratory failure EXAM: PORTABLE CHEST 1 VIEW COMPARISON:  06/03/2016 FINDINGS: Mild chronic appearing interstitial coarsening. No airspace consolidation. No effusion. Normal pulmonary vasculature. Unremarkable hilar and mediastinal contours. IMPRESSION: No consolidation or effusion. Chronic appearing interstitial coarsening. Electronically Signed   By: Ellery Plunk M.D.   On: 06/04/2016 06:06   Dg Chest Port 1 View  Result Date: 06/03/2016 CLINICAL DATA:  66 year old male with respiratory failure. EXAM: PORTABLE CHEST 1 VIEW COMPARISON:  06/02/2016 and earlier. FINDINGS: Portable AP supine view at 0514 hours. Stable endotracheal tube tip between the clavicles and carina. Enteric tube courses to the abdomen, side hole to level of the gastric fundus. Mildly lower lung volumes. Mildly increased streaky retrocardiac opacity. Continued widespread bilateral interstitial opacity. No pneumothorax. No pleural effusion is evident. Calcified aortic atherosclerosis. And negative visible bowel gas pattern. IMPRESSION: 1.  Stable lines and tubes. 2. Mildly lower lung volumes. Continued bilateral widespread interstitial opacity and interval increased streaky left lung base opacity. Favor acute viral/atypical respiratory infection. 3. No definite pleural effusion. Electronically Signed   By: Odessa Fleming M.D.   On: 06/03/2016 08:07   Dg Chest Port 1 View  Result Date: 06/02/2016 CLINICAL DATA:  Status post endotracheal tube placement. EXAM: PORTABLE CHEST 1 VIEW COMPARISON:  CT chest 05/15/2016. FINDINGS: Endotracheal tube is in place with the tip in good position 4.2 cm above the carina. NG tube courses into the stomach and below the inferior margin of the film. Bibasilar airspace disease is worse on the right. No pneumothorax or pleural fluid. Aortic atherosclerosis is noted. Heart size is normal. IMPRESSION: ETT and NG tube in good position. Mild bibasilar airspace disease is more notable on the left likely due to atelectasis. Electronically Signed   By: Drusilla Kanner M.D.   On: 06/02/2016 14:34   Dg C-arm Gt 120 Min-no Report  Result Date: 06/02/2016 Fluoroscopy was utilized by the requesting physician.  No radiographic interpretation.   Dg C-arm Gt 120 Min-no Report  Result Date: 06/02/2016 Fluoroscopy was utilized by the requesting physician.  No radiographic  interpretation.    Discharge Exam: Blood pressure (!) 154/60, pulse 95, temperature  98.1 F (36.7 C), temperature source Oral, resp. rate 20, height 5\' 4"  (1.626 m), weight (!) 303 lb 12.7 oz (137.8 kg), SpO2 95 %.  Generally - awake, alert and in no apparent distress HEENT: atraumatic normocephalic Chest: Normal respiratory effort Cardiovascular: regular rate and rhythm Abdomen: Obese, soft and nontender Back: Dressing in place and dry GU: Foley catheter draining clear urine  Disposition: 01-Home or Self Care  Discharge Instructions    Discharge patient    Complete by:  As directed    Discharge disposition:  01-Home or Self Care   Discharge patient date:  06/04/2016   Foley catheter - discontinue    Complete by:  As directed      Allergies as of 06/04/2016      Reactions   Vancomycin Anaphylaxis, Hives, Other (See Comments)   Organs shut down, possibly due to vancomycin.   Lisinopril Other (See Comments)   Possibly hives, but taking Vancomycin at same time   Rifampin    "was taking this along w/Vancomycin; not sure if reaction was result of this RX too"      Medication List    STOP taking these medications   ibuprofen 200 MG tablet Commonly known as:  ADVIL,MOTRIN   oxyCODONE 5 MG immediate release tablet Commonly known as:  Oxy IR/ROXICODONE   phenazopyridine 200 MG tablet Commonly known as:  PYRIDIUM   Trospium Chloride 60 MG Cp24     TAKE these medications   albuterol 108 (90 Base) MCG/ACT inhaler Commonly known as:  PROVENTIL HFA;VENTOLIN HFA Inhale 1-2 puffs into the lungs every 6 (six) hours as needed for wheezing or shortness of breath.   ALL DAY ALLERGY 10 MG tablet Generic drug:  cetirizine Take 10 mg by mouth daily as needed for allergies.   amLODipine 5 MG tablet Commonly known as:  NORVASC Take 5 mg by mouth daily.   aspirin EC 81 MG tablet Take 81 mg by mouth daily.   budesonide-formoterol 160-4.5 MCG/ACT inhaler Commonly known as:   SYMBICORT Inhale 2 puffs into the lungs 2 (two) times daily.   clonazePAM 1 MG tablet Commonly known as:  KLONOPIN Take 1 mg by mouth 3 (three) times daily as needed for anxiety.   glipiZIDE 10 MG tablet Commonly known as:  GLUCOTROL Take 10 mg by mouth 2 (two) times daily.   insulin glargine 100 UNIT/ML injection Commonly known as:  LANTUS Inject 20 Units into the skin daily. (0900)   loperamide 2 MG tablet Commonly known as:  IMODIUM A-D Take 6 mg by mouth 2 (two) times daily. (0900 & 1500)   losartan 100 MG tablet Commonly known as:  COZAAR Take 100 mg by mouth daily.   metFORMIN 1000 MG tablet Commonly known as:  GLUCOPHAGE Take 1,000 mg by mouth 2 (two) times daily.   pioglitazone 15 MG tablet Commonly known as:  ACTOS Take 15 mg by mouth daily.   pravastatin 80 MG tablet Commonly known as:  PRAVACHOL Take 40 mg by mouth every evening.   ropinirole 5 MG tablet Commonly known as:  REQUIP Take 15 mg by mouth at bedtime.   tiotropium 18 MCG inhalation capsule Commonly known as:  SPIRIVA Place 18 mcg into inhaler and inhale every evening.   traMADol 50 MG tablet Commonly known as:  ULTRAM Take 1-2 tablets (50-100 mg total) by mouth every 6 (six) hours as needed for moderate pain.   Vitamin D3 2000 units Tabs Take 2,000 Units by mouth daily.  Follow-up Information    Crist Fat, MD On 06/16/2016.   Specialty:  Urology Why:  2:45pm, Stent removal Contact information: [address] [place] [address] [phone]           Signed: Garnett Farm 06/04/2016, 9:01 AM

## 2016-06-05 ENCOUNTER — Encounter (HOSPITAL_COMMUNITY): Payer: Self-pay | Admitting: Urology

## 2016-06-14 DIAGNOSIS — L509 Urticaria, unspecified: Secondary | ICD-10-CM | POA: Diagnosis not present

## 2016-06-16 DIAGNOSIS — N202 Calculus of kidney with calculus of ureter: Secondary | ICD-10-CM | POA: Diagnosis not present

## 2016-07-01 NOTE — Addendum Note (Signed)
Addendum  created 07/01/16 0917 by Nancy Manuele, MD   Sign clinical note    

## 2016-08-01 DIAGNOSIS — N202 Calculus of kidney with calculus of ureter: Secondary | ICD-10-CM | POA: Diagnosis not present

## 2016-08-01 DIAGNOSIS — Z87442 Personal history of urinary calculi: Secondary | ICD-10-CM | POA: Diagnosis not present

## 2016-09-08 DIAGNOSIS — Z125 Encounter for screening for malignant neoplasm of prostate: Secondary | ICD-10-CM | POA: Diagnosis not present

## 2016-09-08 DIAGNOSIS — Z79899 Other long term (current) drug therapy: Secondary | ICD-10-CM | POA: Diagnosis not present

## 2016-09-08 DIAGNOSIS — E119 Type 2 diabetes mellitus without complications: Secondary | ICD-10-CM | POA: Diagnosis not present

## 2016-09-08 DIAGNOSIS — G4730 Sleep apnea, unspecified: Secondary | ICD-10-CM | POA: Diagnosis not present

## 2016-09-08 DIAGNOSIS — G2581 Restless legs syndrome: Secondary | ICD-10-CM | POA: Diagnosis not present

## 2016-09-13 DIAGNOSIS — I10 Essential (primary) hypertension: Secondary | ICD-10-CM | POA: Diagnosis not present

## 2016-09-13 DIAGNOSIS — E1129 Type 2 diabetes mellitus with other diabetic kidney complication: Secondary | ICD-10-CM | POA: Diagnosis not present

## 2016-09-13 DIAGNOSIS — L299 Pruritus, unspecified: Secondary | ICD-10-CM | POA: Diagnosis not present

## 2016-09-13 DIAGNOSIS — E785 Hyperlipidemia, unspecified: Secondary | ICD-10-CM | POA: Diagnosis not present

## 2016-09-19 DIAGNOSIS — T24231A Burn of second degree of right lower leg, initial encounter: Secondary | ICD-10-CM | POA: Diagnosis not present

## 2016-10-09 DIAGNOSIS — L503 Dermatographic urticaria: Secondary | ICD-10-CM | POA: Diagnosis not present

## 2016-10-16 DIAGNOSIS — N202 Calculus of kidney with calculus of ureter: Secondary | ICD-10-CM | POA: Diagnosis not present

## 2016-10-25 DIAGNOSIS — R1084 Generalized abdominal pain: Secondary | ICD-10-CM | POA: Diagnosis not present

## 2016-10-25 DIAGNOSIS — R109 Unspecified abdominal pain: Secondary | ICD-10-CM | POA: Diagnosis not present

## 2016-10-25 DIAGNOSIS — Z87442 Personal history of urinary calculi: Secondary | ICD-10-CM | POA: Diagnosis not present

## 2016-10-25 DIAGNOSIS — N130 Hydronephrosis with ureteropelvic junction obstruction: Secondary | ICD-10-CM | POA: Diagnosis not present

## 2016-12-11 DIAGNOSIS — E1129 Type 2 diabetes mellitus with other diabetic kidney complication: Secondary | ICD-10-CM | POA: Diagnosis not present

## 2016-12-18 DIAGNOSIS — E1129 Type 2 diabetes mellitus with other diabetic kidney complication: Secondary | ICD-10-CM | POA: Diagnosis not present

## 2016-12-18 DIAGNOSIS — F411 Generalized anxiety disorder: Secondary | ICD-10-CM | POA: Diagnosis not present

## 2016-12-18 DIAGNOSIS — I10 Essential (primary) hypertension: Secondary | ICD-10-CM | POA: Diagnosis not present

## 2017-03-01 DIAGNOSIS — N202 Calculus of kidney with calculus of ureter: Secondary | ICD-10-CM | POA: Diagnosis not present

## 2017-03-01 DIAGNOSIS — N132 Hydronephrosis with renal and ureteral calculous obstruction: Secondary | ICD-10-CM | POA: Diagnosis not present

## 2017-03-15 DIAGNOSIS — N200 Calculus of kidney: Secondary | ICD-10-CM | POA: Diagnosis not present

## 2017-03-15 DIAGNOSIS — E1129 Type 2 diabetes mellitus with other diabetic kidney complication: Secondary | ICD-10-CM | POA: Diagnosis not present

## 2017-03-15 DIAGNOSIS — N202 Calculus of kidney with calculus of ureter: Secondary | ICD-10-CM | POA: Diagnosis not present

## 2017-03-20 ENCOUNTER — Encounter: Payer: Self-pay | Admitting: Allergy & Immunology

## 2017-03-20 ENCOUNTER — Ambulatory Visit (INDEPENDENT_AMBULATORY_CARE_PROVIDER_SITE_OTHER): Payer: Non-veteran care | Admitting: Allergy & Immunology

## 2017-03-20 VITALS — BP 130/74 | HR 101 | Temp 97.5°F | Resp 20 | Ht 66.0 in | Wt 283.6 lb

## 2017-03-20 DIAGNOSIS — L508 Other urticaria: Secondary | ICD-10-CM | POA: Diagnosis not present

## 2017-03-20 DIAGNOSIS — J449 Chronic obstructive pulmonary disease, unspecified: Secondary | ICD-10-CM | POA: Insufficient documentation

## 2017-03-20 MED ORDER — MONTELUKAST SODIUM 10 MG PO TABS: 10.0000 mg | ORAL_TABLET | Freq: Every day | ORAL | 5 refills | Status: DC

## 2017-03-20 MED ORDER — RANITIDINE HCL 150 MG PO TABS: 150.0000 mg | ORAL_TABLET | Freq: Every day | ORAL | 5 refills | Status: DC

## 2017-03-20 NOTE — Patient Instructions (Addendum)
1. Chronic urticaria - Your history does not have any "red flags" such as fevers, joint pains, or permanent skin changes that would be concerning for a more serious cause of hives.  - We will get some labs to rule out serious causes of hives: complete blood count, tryptase level, chronic urticaria panel, CMP, ESR, and CRP. - We will also get an environmental allergy panel to see if there are environmental allergens that might be contributing to your symptoms.  - Chronic hives are often times a self limited process and will "burn themselves out" over 6-12 months, although this is not always the case.  - In the meantime, start suppressive dosing of antihistamines:   - Morning: Allegra (fexofenadine) '360mg'$  (two tablets) OR Xyzal (levocetirizine) '10mg'$  (two tablets)  - Evening: Zyrtec (cetirizine) '20mg'$  (two tablets) + Zantac (ranitidine) '300mg'$  (two tablets) + Singulair (montelukast) '10mg'$  daily - You can change this dosing at home, decreasing the dose as needed or increasing the dosing as needed.  - If you are not tolerating the medications or are tired of taking them every day, we can start treatment with a monthly injectable medication called Xolair.   2. Return in about 2 months (around 05/18/2017).   Please inform us of any Emergency Department visits, hospitalizations, or changes in symptoms. Call us before going to the ED for breathing or allergy symptoms since we might be able to fit you in for a sick visit. Feel free to contact us anytime with any questions, problems, or concerns.  It was a pleasure to meet you today!   Websites that have reliable patient information: 1. American Academy of Asthma, Allergy, and Immunology: www.aaaai.org 2. Food Allergy Research and Education (FARE): foodallergy.org 3. Mothers of Asthmatics: http://www.asthmacommunitynetwork.org 4. American College of Allergy, Asthma, and Immunology: www.acaai.org
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
## 2017-03-21 DIAGNOSIS — L508 Other urticaria: Secondary | ICD-10-CM | POA: Diagnosis not present

## 2017-03-22 DIAGNOSIS — J449 Chronic obstructive pulmonary disease, unspecified: Secondary | ICD-10-CM | POA: Diagnosis not present

## 2017-03-22 DIAGNOSIS — E1129 Type 2 diabetes mellitus with other diabetic kidney complication: Secondary | ICD-10-CM | POA: Diagnosis not present

## 2017-03-22 DIAGNOSIS — Z6841 Body Mass Index (BMI) 40.0 and over, adult: Secondary | ICD-10-CM | POA: Diagnosis not present

## 2017-03-22 DIAGNOSIS — I10 Essential (primary) hypertension: Secondary | ICD-10-CM | POA: Diagnosis not present

## 2017-03-27 ENCOUNTER — Other Ambulatory Visit: Payer: Self-pay | Admitting: *Deleted

## 2017-03-27 ENCOUNTER — Telehealth: Payer: Self-pay

## 2017-03-27 LAB — CBC WITH DIFFERENTIAL/PLATELET
BASOS ABS: 0 10*3/uL (ref 0.0–0.2)
Basos: 0 %
EOS (ABSOLUTE): 0.2 10*3/uL (ref 0.0–0.4)
Eos: 2 %
Hematocrit: 36.8 % — ABNORMAL LOW (ref 37.5–51.0)
Hemoglobin: 11.9 g/dL — ABNORMAL LOW (ref 13.0–17.7)
IMMATURE GRANULOCYTES: 1 %
Immature Grans (Abs): 0.1 10*3/uL (ref 0.0–0.1)
LYMPHS: 20 %
Lymphocytes Absolute: 2.2 10*3/uL (ref 0.7–3.1)
MCH: 28.9 pg (ref 26.6–33.0)
MCHC: 32.3 g/dL (ref 31.5–35.7)
MCV: 89 fL (ref 79–97)
Monocytes Absolute: 0.5 10*3/uL (ref 0.1–0.9)
Monocytes: 5 %
NEUTROS PCT: 72 %
Neutrophils Absolute: 8.1 10*3/uL — ABNORMAL HIGH (ref 1.4–7.0)
PLATELETS: 239 10*3/uL (ref 150–379)
RBC: 4.12 x10E6/uL — AB (ref 4.14–5.80)
RDW: 17.7 % — ABNORMAL HIGH (ref 12.3–15.4)
WBC: 11.1 10*3/uL — ABNORMAL HIGH (ref 3.4–10.8)

## 2017-03-27 LAB — IGE+ALLERGENS ZONE 2(30)
Alternaria Alternata IgE: <0.1 kU/L
Amer Sycamore IgE Qn: <0.1 kU/L
Aspergillus Fumigatus IgE: <0.1 kU/L
Bahia Grass IgE: <0.1 kU/L
Bermuda Grass IgE: <0.1 kU/L
Cat Dander IgE: <0.1 kU/L
Cedar, Mountain IgE: <0.1 kU/L
Cladosporium Herbarum IgE: <0.1 kU/L
Common Silver Birch IgE: <0.1 kU/L
D Farinae IgE: <0.1 kU/L
Dog Dander IgE: <0.1 kU/L
IGE (IMMUNOGLOBULIN E), SERUM: 30 [IU]/mL (ref 0–100)
Johnson Grass IgE: <0.1 kU/L
Mucor Racemosus IgE: <0.1 kU/L
Mugwort IgE Qn: <0.1 kU/L
Penicillium Chrysogen IgE: <0.1 kU/L
Ragweed, Short IgE: <0.1 kU/L
Stemphylium Herbarum IgE: <0.1 kU/L
Sweet gum IgE RAST Ql: <0.1 kU/L
Timothy Grass IgE: <0.1 kU/L
White Mulberry IgE: <0.1 kU/L

## 2017-03-27 LAB — CMP14+EGFR
ALT: 33 IU/L (ref 0–44)
AST: 34 IU/L (ref 0–40)
Albumin/Globulin Ratio: 1.7 (ref 1.2–2.2)
Albumin: 4 g/dL (ref 3.6–4.8)
Alkaline Phosphatase: 111 IU/L (ref 39–117)
BUN/Creatinine Ratio: 12 (ref 10–24)
BUN: 14 mg/dL (ref 8–27)
Bilirubin Total: 0.4 mg/dL (ref 0.0–1.2)
CALCIUM: 9.4 mg/dL (ref 8.6–10.2)
CO2: 22 mmol/L (ref 20–29)
Chloride: 101 mmol/L (ref 96–106)
Creatinine, Ser: 1.16 mg/dL (ref 0.76–1.27)
GFR calc Af Amer: 74 mL/min/{1.73_m2} (ref >59)
GFR, EST NON AFRICAN AMERICAN: 64 mL/min/{1.73_m2} (ref >59)
Globulin, Total: 2.3 g/dL (ref 1.5–4.5)
Glucose: 197 mg/dL — ABNORMAL HIGH (ref 65–99)
POTASSIUM: 5.5 mmol/L — AB (ref 3.5–5.2)
Sodium: 141 mmol/L (ref 134–144)
Total Protein: 6.3 g/dL (ref 6.0–8.5)

## 2017-03-27 LAB — SEDIMENTATION RATE: Sed Rate: 17 mm/hr (ref 0–30)

## 2017-03-27 LAB — ALPHA-GAL PANEL
Alpha Gal IgE*: 1.92 kU/L — ABNORMAL HIGH (ref <0.10)
BEEF (BOS SPP) IGE: 0.36 kU/L — AB (ref <0.35)
BEEF CLASS INTERPRETATION: 1
Class Interpretation: 0
Pork (Sus spp) IgE: 0.12 kU/L (ref <0.35)

## 2017-03-27 LAB — C-REACTIVE PROTEIN: CRP: 10.8 mg/L — AB (ref 0.0–4.9)

## 2017-03-27 LAB — TRYPTASE: TRYPTASE: 5.5 ug/L (ref 2.2–13.2)

## 2017-03-27 MED ORDER — OMALIZUMAB 150 MG ~~LOC~~ SOLR: 300.0000 mg | SUBCUTANEOUS | 11 refills | Status: DC

## 2017-03-27 MED ORDER — OMALIZUMAB 150 MG ~~LOC~~ SOLR: 300.0000 mg | SUBCUTANEOUS | 11 refills | Status: AC

## 2017-03-27 NOTE — Telephone Encounter (Signed)
I will go ahead and [facility] to [facility] and send to [place] to administer

## 2017-03-27 NOTE — Telephone Encounter (Signed)
Patient was seen on 03/20/2017. He would like to start xolair. He has [facility] and I have [place]ed a [place] to [place] to allow the [place] to start Xolair. Will follow back up with the VA by the end of the week. The patient and I both agreed that the [place] can take a few weeks to give us an approval. Will call the patient once I hear back.

## 2017-03-27 NOTE — Telephone Encounter (Signed)
Noted. Thanks for keeping me updated.  Malachi BondsJoel Gallagher, MD [facility] [place]

## 2017-03-29 ENCOUNTER — Telehealth: Payer: Self-pay | Admitting: *Deleted

## 2017-04-02 NOTE — Telephone Encounter (Signed)
[facility] [place] will reach out to patient in next week to advise if approved and when to expect shipment

## 2017-04-09 NOTE — Telephone Encounter (Signed)
Awaiting provider signature from the [place] to authorize the patient to receive injections.

## 2017-04-16 ENCOUNTER — Telehealth: Payer: Self-pay | Admitting: *Deleted

## 2017-04-16 NOTE — Telephone Encounter (Signed)
Wanted to let you know that [facility] has denied patient for Xolair based on needs to be on Montelukast at least 4 weeks and fail and Cyclosporin considered.  They have recently put these biologics to approval and this is what they have come up with. I am going to fax you the denial so you can see and make determination what to do next for patient. (I was going to attach but it seems that cannot be done)

## 2017-04-17 NOTE — Telephone Encounter (Signed)
I agree. If we can get him through Montelukast for 4 weeks I think we should try and I have just the letter I want to send to let them know what we think of using cyclosporine.

## 2017-04-17 NOTE — Telephone Encounter (Signed)
Mr. Lucas Clark has ben on montelukast since his appointment last month (prescribped 03/20/17), so he has technically failed at this point. I will consider cyclosporine (wait five seconds) - OK done. We are not doing cyclosporine due to the serious adverse side effects.   [name] - can you call Mr. Lucas Clark to let him know that we have one more hoop to jump through? We will work with Tammy to get this approved in a hopefully timely manner.   Thanks!  Malachi BondsJoel Seydou Hearns, MD [facility] [place]

## 2017-04-17 NOTE — Telephone Encounter (Signed)
Patient was denied, please see telphone note above.

## 2017-04-18 ENCOUNTER — Telehealth: Payer: Self-pay

## 2017-04-18 NOTE — Telephone Encounter (Signed)
VA Referral Exp 04/27/17 Faxed new Request to [facility]. Fax# [phone].

## 2017-04-19 NOTE — Telephone Encounter (Signed)
Let's go ahead and get that letter written. He has been on montelukast since I saw him, so he has failed montelukast.  Lucas BondsJoel Alano Blasco, MD [facility] [place]

## 2017-04-20 NOTE — Telephone Encounter (Signed)
Patient has called to follow up on injections Can he start them and when?? Please call

## 2017-04-24 NOTE — Telephone Encounter (Signed)
[facility] done and [facility]

## 2017-04-25 NOTE — Telephone Encounter (Signed)
I called the patient to inform him that the letter had been done and faxed.

## 2017-04-27 DIAGNOSIS — N202 Calculus of kidney with calculus of ureter: Secondary | ICD-10-CM | POA: Diagnosis not present

## 2017-05-02 ENCOUNTER — Telehealth: Payer: Self-pay | Admitting: *Deleted

## 2017-05-02 NOTE — Telephone Encounter (Signed)
Sounds great!! Team Work!!!

## 2017-05-02 NOTE — Telephone Encounter (Signed)
I have not heard anything yet.  I will refax documents again since I have heard anything.  There is really no way for us to contact pharmacy directly since they will not talk to anyone except the veteran

## 2017-05-02 NOTE — Telephone Encounter (Signed)
Have we heard anything back about this patient

## 2017-05-02 NOTE — Telephone Encounter (Signed)
Noted. Thanks much, Tammy!   Lucas BondsJoel Junius Faucett, MD [facility] [place] [place]

## 2017-05-02 NOTE — Telephone Encounter (Signed)
[facility] [place] and she advised Xolair is approved.  Will be sending for delivery for arrival either tomorrow or Tuesday. And will ship out around the 5th monthly.  I will contact patient and advise him to contact our office on Tuesday to see if same has arrived and set up start appt.

## 2017-05-02 NOTE — Telephone Encounter (Signed)
Okay Thanks Tam. Maybe the patient can call and see if they received the letter.

## 2017-05-08 ENCOUNTER — Ambulatory Visit (INDEPENDENT_AMBULATORY_CARE_PROVIDER_SITE_OTHER): Payer: PRIVATE HEALTH INSURANCE | Admitting: *Deleted

## 2017-05-08 DIAGNOSIS — L508 Other urticaria: Secondary | ICD-10-CM

## 2017-05-08 DIAGNOSIS — L501 Idiopathic urticaria: Secondary | ICD-10-CM | POA: Diagnosis not present

## 2017-05-08 MED ORDER — EPINEPHRINE 0.3 MG/0.3ML IJ SOAJ: 0.3000 mg | Freq: Once | INTRAMUSCULAR | 0 refills | Status: AC

## 2017-05-09 NOTE — Progress Notes (Signed)
Immunotherapy   Patient Details  Name: Lucas Clark MRN: [id number] Date of Birth: Nov 19, 1950  05/08/2017  Lucas Clark : Patient started Xolair injections.  Patient received Xolair sample in office.  Following schedule: 300 mg  Frequency: Every 4 weeks Epi-Pen: Epi-Pen RX was sent in to the pharmacy for the patient.   Consent signed and patient instructions given.  Patient waited 60 minutes after injections and no issues.   Lucas Clark 05/09/2017, 4:06 PM

## 2017-05-10 MED ORDER — OMALIZUMAB 150 MG ~~LOC~~ SOLR
300.0000 mg | SUBCUTANEOUS | Status: AC
  Administered 2017-05-08 – 2017-10-30 (×7): 300 mg via SUBCUTANEOUS

## 2017-05-22 ENCOUNTER — Encounter: Payer: Self-pay | Admitting: Allergy & Immunology

## 2017-05-22 ENCOUNTER — Ambulatory Visit (INDEPENDENT_AMBULATORY_CARE_PROVIDER_SITE_OTHER): Payer: PRIVATE HEALTH INSURANCE | Admitting: Allergy & Immunology

## 2017-05-22 VITALS — BP 130/82 | HR 103 | Resp 21

## 2017-05-22 DIAGNOSIS — J441 Chronic obstructive pulmonary disease with (acute) exacerbation: Secondary | ICD-10-CM

## 2017-05-22 DIAGNOSIS — L508 Other urticaria: Secondary | ICD-10-CM

## 2017-05-22 NOTE — Progress Notes (Signed)
FOLLOW UP  Date of Service/Encounter:  05/22/17   Assessment:   Chronic idiopathic urticaria  Chronic obstructive pulmonary disease with acute exacerbation - on Symbicort and Spiriva    Complicated past medical history, including hypertension, hyperlipidemia, chronic pain, OSA, and depression/anxiety  Plan/Recommendations:   1. Chronic urticaria - improved on Xolair - Continue with the monthly injections of Xolair.  - Continue with antihistamines as needed.   2. COPD exacerbation - Lung function looked low today but it did improve with the DuoNeb treatment. - Start the prednisone pack provided to help control inflammation.  - I think a visit with a Pulmonologist would be a good idea.  - Daily controller medication(s): Singulair 10mg  daily, Symbicort 160/4.62mcg two puffs twice daily with spacer and Spiriva 1.37mcg two puffs once daily - Prior to physical activity: ProAir 2 puffs 10-15 minutes before physical activity. - Rescue medications: Combivent Respimat one puff, ProAir 4 puffs every 4-6 hours as needed, albuterol nebulizer one vial every 4-6 hours as needed or DuoNeb nebulizer one vial every 4-6 hours as needed - Asthma control goals:  * Full participation in all desired activities (may need albuterol before activity) * Albuterol use two time or less a week on average (not counting use with activity) * Cough interfering with sleep two time or less a month * Oral steroids no more than once a year * No hospitalizations  3. Return in about 4 months (around 09/21/2017).   Subjective:   Lucas Clark is a 67 y.o. male presenting today for follow up of  Chief Complaint  Patient presents with  . Pruritus    Lucas Clark has a history of the following: Patient Active Problem List   Diagnosis Date Noted  . Chronic urticaria 03/20/2017  . Chronic obstructive pulmonary disease (HCC) 03/20/2017  . H/O nephrolithotomy with removal of calculi 06/02/2016  .  Hydronephrosis 05/14/2016  . Nephrolithiasis 05/14/2016  . Vitreous hemorrhage of right eye (HCC) 05/23/2011  . RESTLESS LEGS SYNDROME 05/31/2007  . OBSTRUCTIVE SLEEP APNEA 02/15/2007    History obtained from: chart review and patient.  Lucas Clark Primary Care Provider is Lucas Perches, MD.     Lucas Clark is a 67 y.o. male presenting for a sick visit.  He was last seen as a new patient in February 2019.  At that time, it was chronic urticaria.  We did obtain some labs to rule out serious causes of hives.  These were all normal.  We also obtain an environmental allergy panel which was normal as well.  The only abnormality noted was a low hemoglobin.  We recommended the use of Allegra 2 tablets in the morning and Zyrtec 2 tablets at night as well as Zantac 300 mg and Singulair 10 mg at night.  This was not providing any improvement of the symptoms, therefore we advanced care to Xolair.  He did receive his first Xolair injection and has done well with this.  Since the last visit, he has done well. He did have some urticaria on his ankles but otherwise has been fine. He has weaned himself off of the antihistamine completely. He does have Singulair on hand but he does not   He is having some problems with breathing. He is unsure whether if it is the pollen or something else. He is using his rescue inhaler quite a bit. He was up a lot last night due to difficulty breathing. He was coughing quite a bit and having some troubles with  sleeping. He was up until 3am this morning. He has not discussed this with Dr. Arlys Clark. He was thinking of going to see a Pulmonologist to manage his breathing problems. ACT today is 11, indicating poor asthma control.    Otherwise, there have been no changes to his past medical history, surgical history, family history, or social history.    Review of Systems: a 14-point review of systems is pertinent for what is mentioned in HPI.  Otherwise, all other systems were  negative. Constitutional: negative other than that listed in the HPI Eyes: negative other than that listed in the HPI Ears, nose, mouth, throat, and face: negative other than that listed in the HPI Respiratory: negative other than that listed in the HPI Cardiovascular: negative other than that listed in the HPI Gastrointestinal: negative other than that listed in the HPI Genitourinary: negative other than that listed in the HPI Integument: negative other than that listed in the HPI Hematologic: negative other than that listed in the HPI Musculoskeletal: negative other than that listed in the HPI Neurological: negative other than that listed in the HPI Allergy/Immunologic: negative other than that listed in the HPI    Objective:   Blood pressure 130/82, pulse (!) 103, resp. rate (!) 21, SpO2 92 %. There is no height or weight on file to calculate BMI.   Physical Exam:  General: Alert, interactive, in no acute distress. Pleasant male. Obese.  Eyes: No conjunctival injection bilaterally, no discharge on the right, no discharge on the left and no Horner-Trantas dots present. PERRL bilaterally. EOMI without pain. No photophobia.  Ears: Right TM pearly gray with normal light reflex, Left TM pearly gray with normal light reflex, Right TM intact without perforation and Left TM intact without perforation.  Nose/Throat: External nose within normal limits and septum midline. Turbinates edematous and pale with clear discharge. Posterior oropharynx erythematous without cobblestoning in the posterior oropharynx. Tonsils 2+ without exudates.  Tongue without thrush. Lungs: Clear to auscultation without wheezing, rhonchi or rales. No increased work of breathing. CV: Normal S1/S2. No murmurs. Capillary refill <2 seconds.  Skin: Warm and dry, without lesions or rashes. Neuro:   Grossly intact. No focal deficits appreciated. Responsive to questions.  Diagnostic studies:   Spirometry: results abnormal  (FEV1: 1.63/61%, FVC: 23.34/61%, FEV1/FVC: 69%).    Spirometry consistent with possible restrictive disease. Albuterol/Atrovent nebulizer treatment given in clinic with no improvement.  Allergy Studies: none    Lucas BondsJoel Hiren Peplinski, MD  [facility] [place] [place]

## 2017-05-22 NOTE — Patient Instructions (Addendum)
1. Chronic urticaria - improved on Xolair - Continue with the monthly injections of Xolair.  - Continue with antihistamines as needed.   2. COPD exacerbation - Lung function looked low today but it did improve slightly with the DuoNeb treatment. - Start the prednisone pack provided to help control inflammation.  - I think a visit with a Pulmonologist would be a good idea.  - Daily controller medication(s): Singulair 10mg  daily, Symbicort 160/4.865mcg two puffs twice daily with spacer and Spiriva 1.5425mcg two puffs once daily - Prior to physical activity: ProAir 2 puffs 10-15 minutes before physical activity. - Rescue medications: Combivent Respimat one puff, ProAir 4 puffs every 4-6 hours as needed, albuterol nebulizer one vial every 4-6 hours as needed or DuoNeb nebulizer one vial every 4-6 hours as needed - Asthma control goals:  * Full participation in all desired activities (may need albuterol before activity) * Albuterol use two time or less a week on average (not counting use with activity) * Cough interfering with sleep two time or less a month * Oral steroids no more than once a year * No hospitalizations  3. Return in about 4 months (around 09/21/2017).   Please inform us of any Emergency Department visits, hospitalizations, or changes in symptoms. Call us before going to the ED for breathing or allergy symptoms since we might be able to fit you in for a sick visit. Feel free to contact us anytime with any questions, problems, or concerns.  It was a pleasure to see you again today!  Websites that have reliable patient information: 1. American Academy of Asthma, Allergy, and Immunology: www.aaaai.org 2. Food Allergy Research and Education (FARE): foodallergy.org 3. Mothers of Asthmatics: http://www.asthmacommunitynetwork.org 4. American College of Allergy, Asthma, and Immunology: www.acaai.org

## 2017-05-24 IMAGING — CT CT ABD-PELV W/O CM
2 of 4 series · 15 of 46 positions shown, 17 images · non-contrast
Comparison: 05/14/2016

CLINICAL DATA: Status post lithotripsy. Evaluate for residual
stones.

EXAM:
CT ABDOMEN AND PELVIS WITHOUT CONTRAST
TECHNIQUE: Multidetector CT imaging of the abdomen and pelvis was performed
following the standard protocol without IV contrast.

[Series 2: axial st · axial · 0.84mm/px · z∈[-655,-270]mm · 12 of 87 slices shown, 14 images]
[im 5/87  soft-tissue]
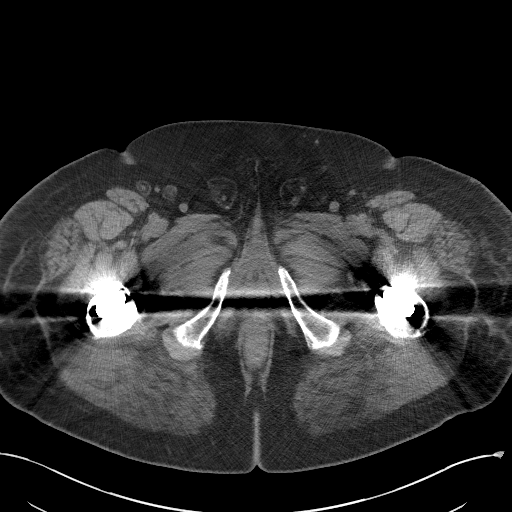
[im 5/87  bone]
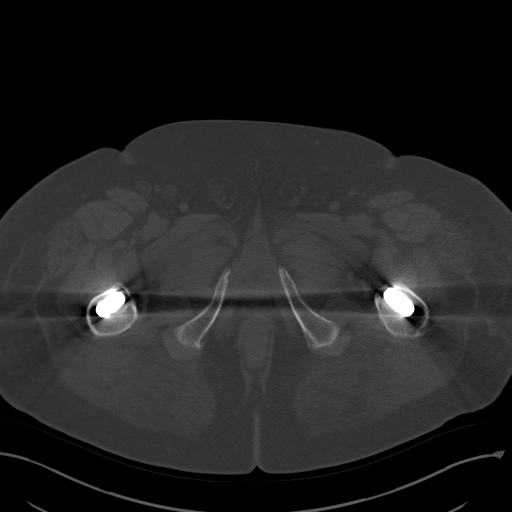
[im 15/87  soft-tissue]
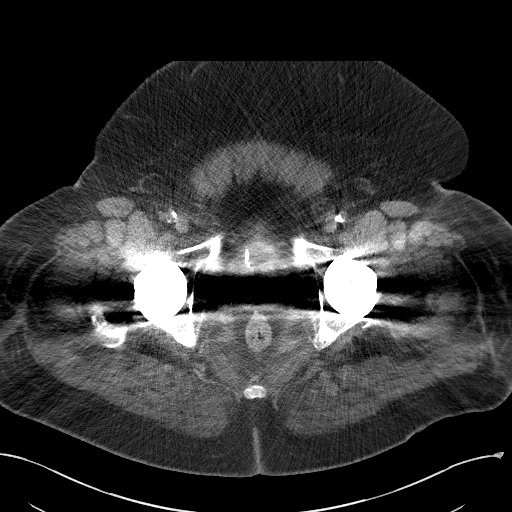
[im 20/87  soft-tissue]
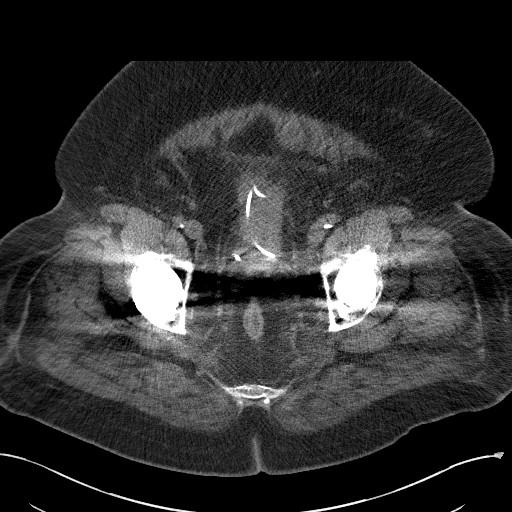
[im 24/87  soft-tissue]
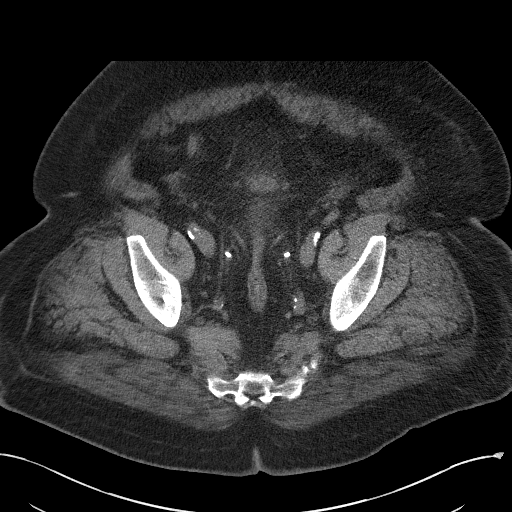
[im 34/87  soft-tissue]
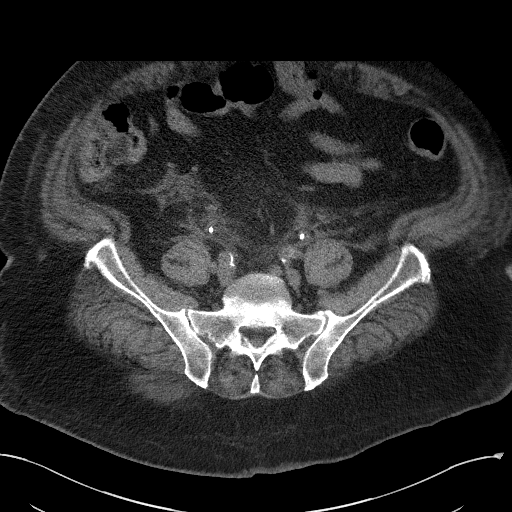
[im 39/87  soft-tissue]
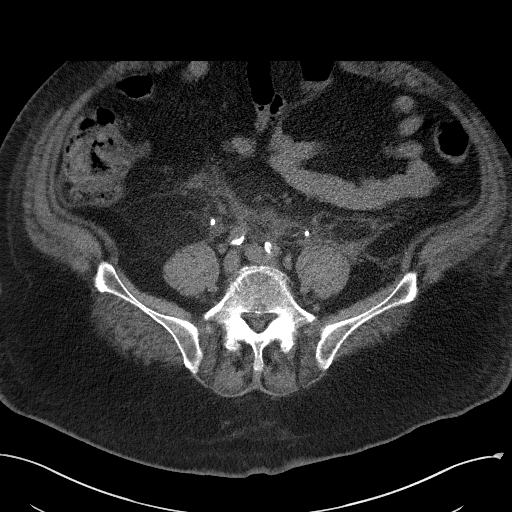
[im 48/87  soft-tissue]
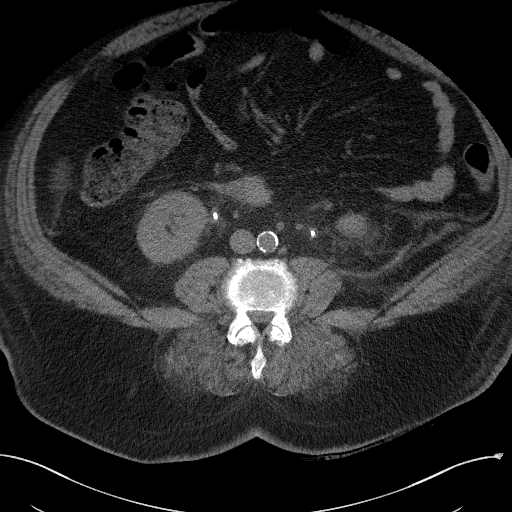
[im 53/87  soft-tissue]
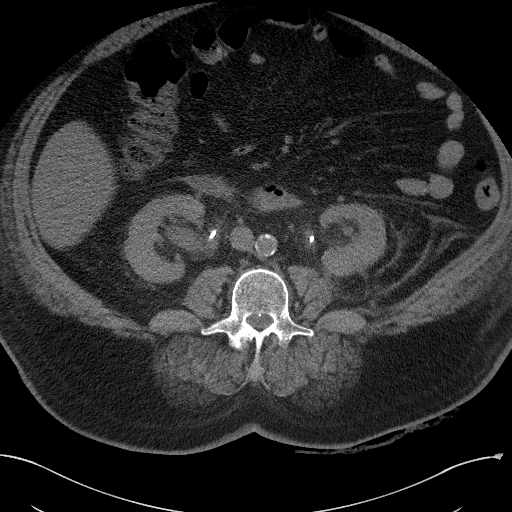
[im 63/87  soft-tissue]
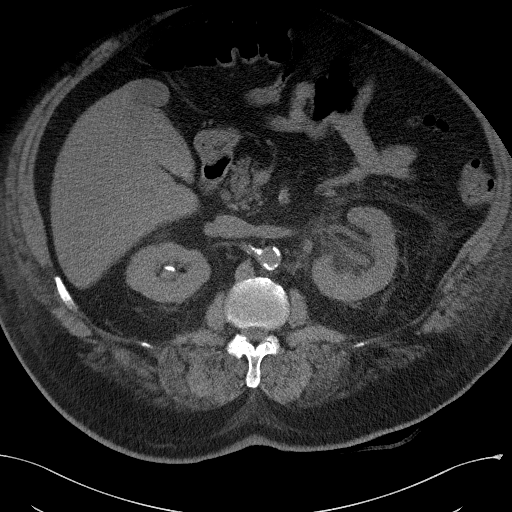
[im 63/87  bone]
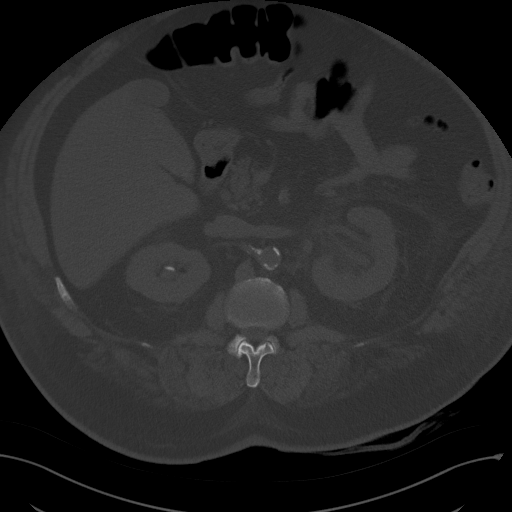
[im 67/87  soft-tissue]
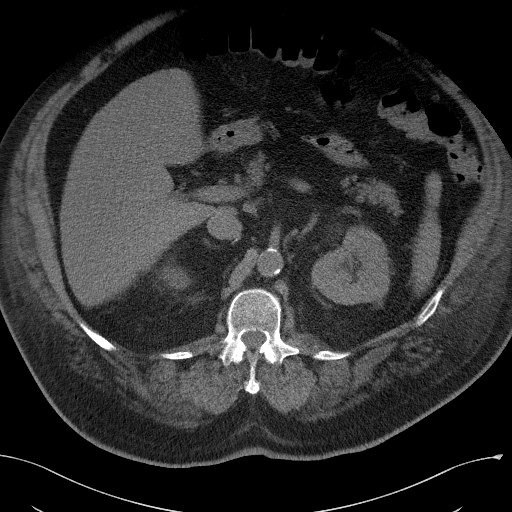
[im 72/87  soft-tissue]
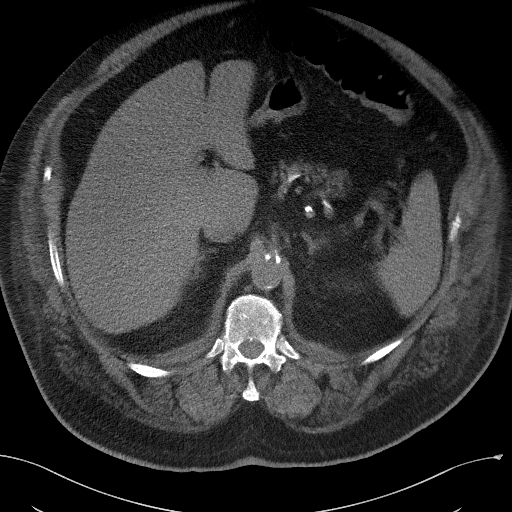
[im 82/87  soft-tissue]
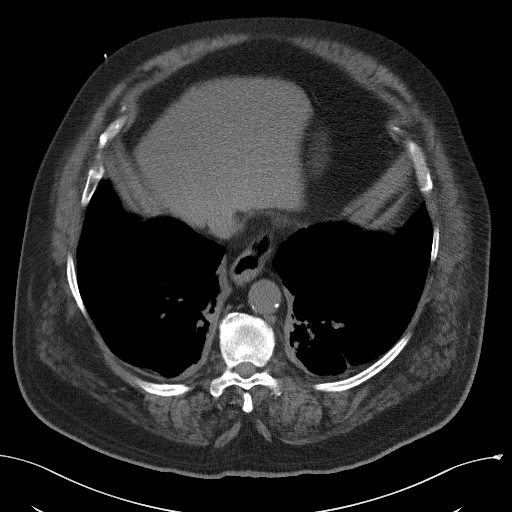

[Series 4: coronal · coronal · 0.84mm/px · 3 of 197 slices shown]
[im 66/197  soft-tissue]
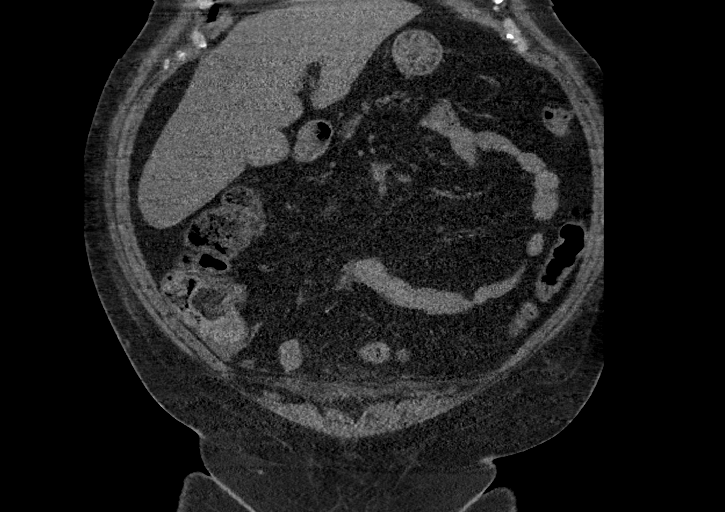
[im 88/197  soft-tissue]
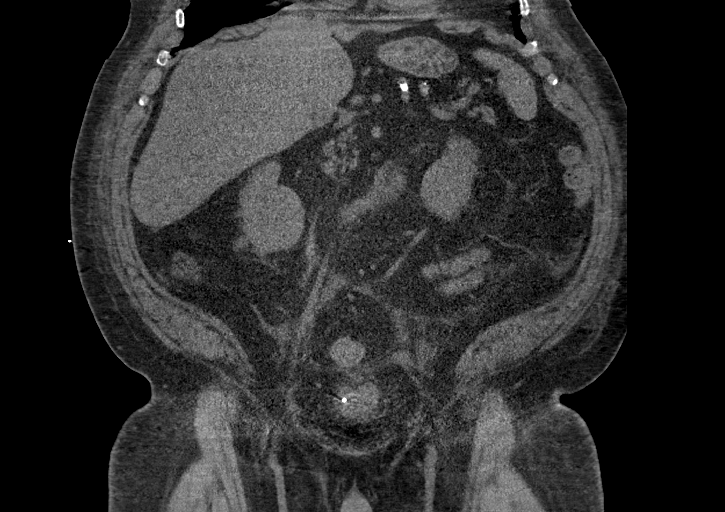
[im 109/197  soft-tissue]
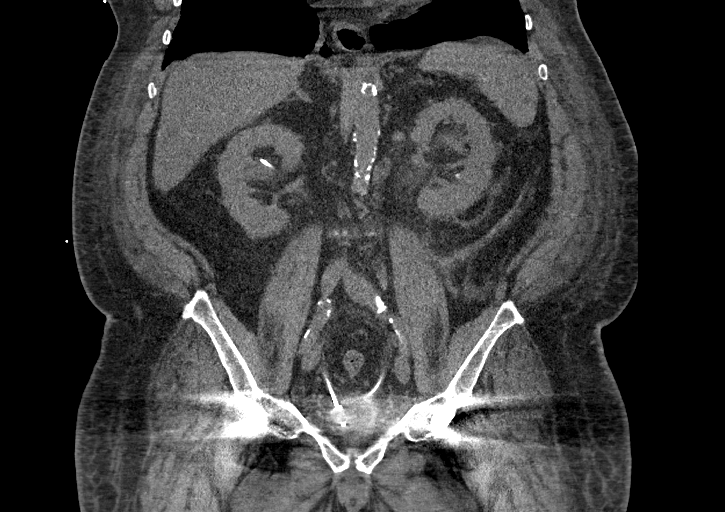

[15 of 46 positions shown; findings below may reference images not displayed]

FINDINGS: Lower chest: Motion degradation at the lung bases. Bibasilar volume
loss. A left lower lobe pulmonary nodule was detailed on the prior
exam, including at 11 mm. Mild cardiomegaly, incompletely imaged.
Small hiatal hernia. Trace bilateral pleural thickening.

Hepatobiliary: Moderate hepatic steatosis and hepatomegaly,
approximately 19 cm. The hepatic dome is partially excluded.
Anterior caudate lobe low-density lesion is likely a cyst.
Gallstones in the neck measure approximately 1.0 cm. No acute
cholecystitis or biliary duct dilatation.

Pancreas: Fatty atrophy involving the pancreas, especially the head
and uncinate process.

Spleen: Normal in size, without focal abnormality.

Adrenals/Urinary Tract: Normal adrenal glands. Bilateral low-density
renal lesions are too small to characterize but likely cysts.
Placement of bilateral ureteric stents . Left renal calcification on
image 29/series 2 is favored to be vascular. No residual renal
collecting system stones. Trace right renal collecting system air is
likely iatrogenic.

Degraded evaluation of the pelvis, secondary to beam hardening
artifact from bilateral hip arthroplasty. Both ureteric stents
terminate in the urinary bladder. No bladder or ureteric stones
identified. Left greater than right perinephric edema persists.
Foley catheter.

Stomach/Bowel: Normal remainder of the stomach. Normal colon and
terminal ileum. Normal small bowel.

Vascular/Lymphatic: Advanced aortic and branch vessel
atherosclerosis. Borderline left external iliac adenopathy at
cm, similar.

Reproductive: Prostate not well evaluated; probable prostatic
calcification adjacent the tract of a Foley, eccentric right, on
image 77/series 2. This is unchanged.

Other: No significant free fluid.

Musculoskeletal: Bilateral hip arthroplasty.
IMPRESSION: 1. Placement of bilateral ureteric stents. No evidence of residual
urinary tract calculi or hydronephrosis.
2. Degraded evaluation of the pelvis, secondary to beam hardening
artifact from bilateral hip arthroplasty.
3. Calcification along the right side of the penile urethra is
favored to be prostatic parenchymal and is similar to on the prior
exam and present back to 04/08/2010.
4. Hepatic steatosis and hepatomegaly.
5. Cholelithiasis.
6.  Aortic atherosclerosis.
7. Left lower lobe pulmonary nodule as detailed on the prior exam.
This is present back on 04/08/2010, favoring a benign etiology.
8. Borderline pelvic adenopathy is not similar back to 6556 and is
likely reactive.

## 2017-05-25 IMAGING — DX DG CHEST 1V PORT
1 series · 1 of 1 positions shown · non-contrast
Comparison: 06/03/2016

CLINICAL DATA: Acute respiratory failure

EXAM:
PORTABLE CHEST 1 VIEW

[chest ap]
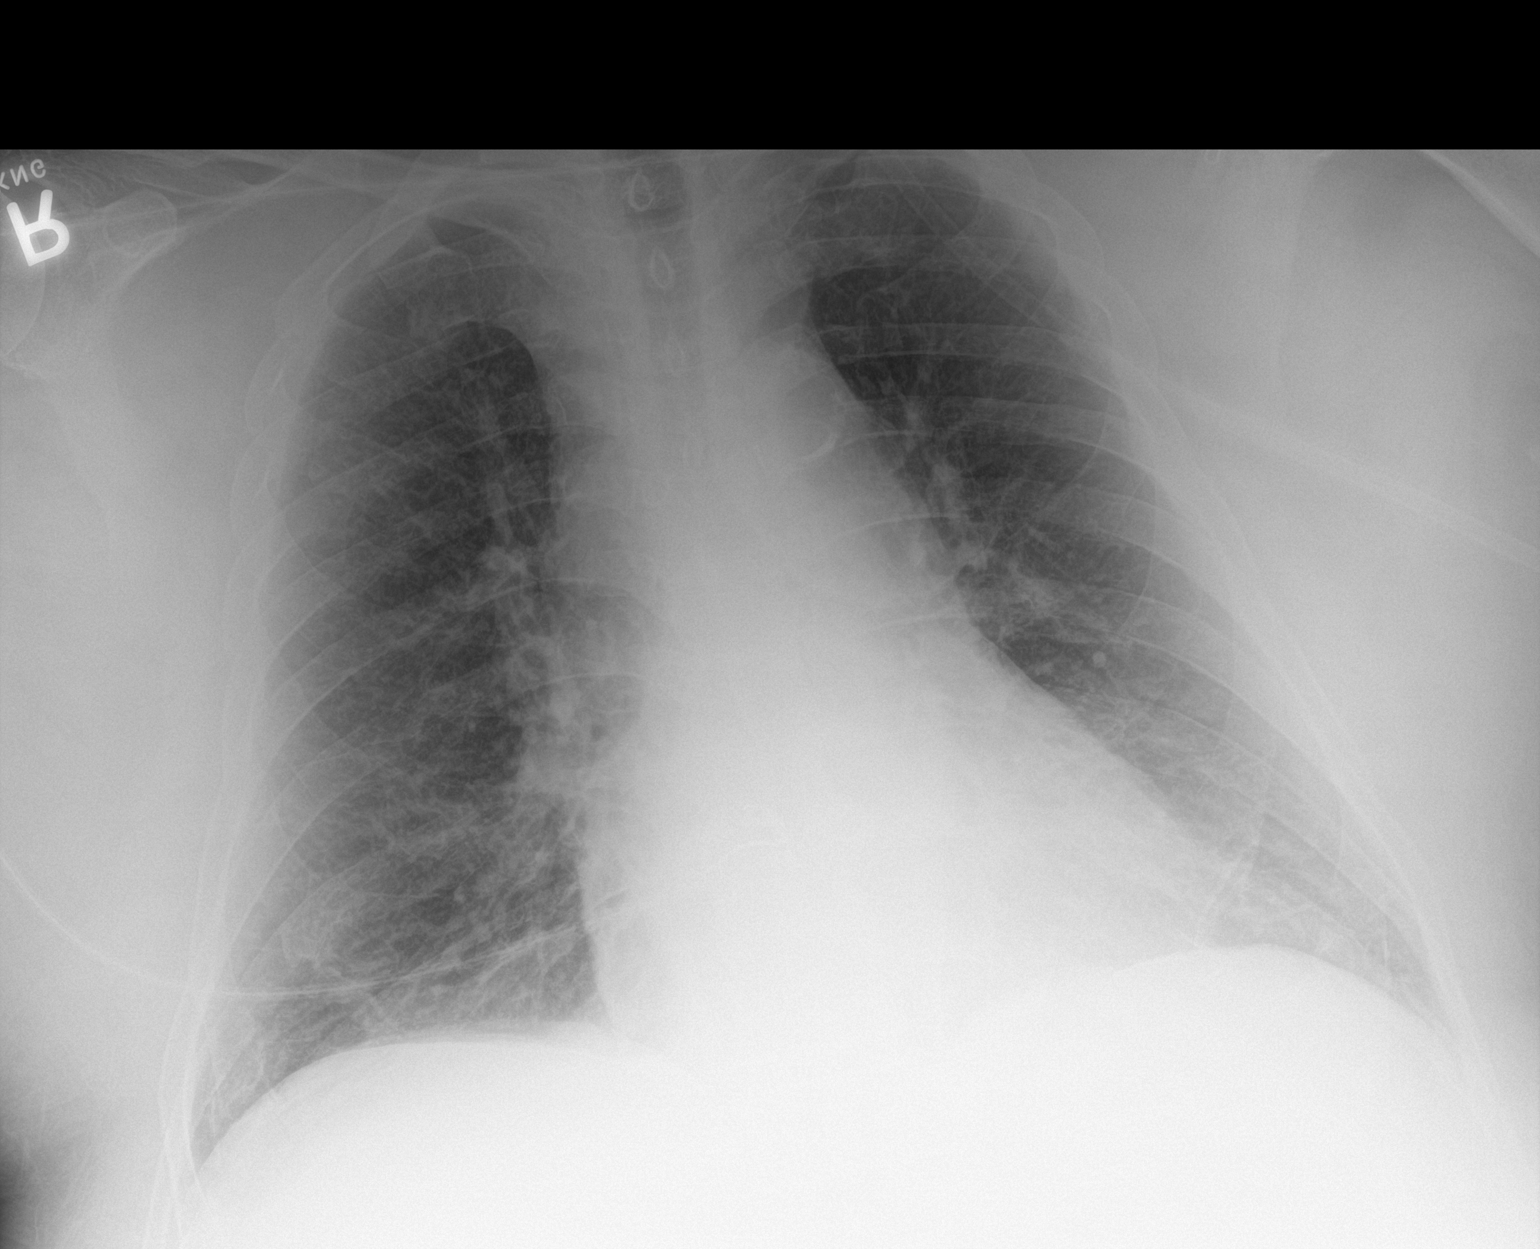

[1 of 1 positions shown; findings below may reference images not displayed]

FINDINGS: Mild chronic appearing interstitial coarsening. No airspace
consolidation. No effusion. Normal pulmonary vasculature.
Unremarkable hilar and mediastinal contours.
IMPRESSION: No consolidation or effusion. Chronic appearing interstitial
coarsening.

## 2017-06-05 ENCOUNTER — Ambulatory Visit: Payer: Non-veteran care

## 2017-06-08 DIAGNOSIS — I10 Essential (primary) hypertension: Secondary | ICD-10-CM | POA: Diagnosis not present

## 2017-06-08 DIAGNOSIS — E1129 Type 2 diabetes mellitus with other diabetic kidney complication: Secondary | ICD-10-CM | POA: Diagnosis not present

## 2017-06-08 DIAGNOSIS — Z79899 Other long term (current) drug therapy: Secondary | ICD-10-CM | POA: Diagnosis not present

## 2017-06-12 ENCOUNTER — Ambulatory Visit (INDEPENDENT_AMBULATORY_CARE_PROVIDER_SITE_OTHER): Payer: PRIVATE HEALTH INSURANCE | Admitting: *Deleted

## 2017-06-12 DIAGNOSIS — L501 Idiopathic urticaria: Secondary | ICD-10-CM

## 2017-06-15 DIAGNOSIS — I10 Essential (primary) hypertension: Secondary | ICD-10-CM | POA: Diagnosis not present

## 2017-06-15 DIAGNOSIS — E1122 Type 2 diabetes mellitus with diabetic chronic kidney disease: Secondary | ICD-10-CM | POA: Diagnosis not present

## 2017-06-15 DIAGNOSIS — E875 Hyperkalemia: Secondary | ICD-10-CM | POA: Diagnosis not present

## 2017-07-06 DIAGNOSIS — E1122 Type 2 diabetes mellitus with diabetic chronic kidney disease: Secondary | ICD-10-CM | POA: Diagnosis not present

## 2017-07-06 DIAGNOSIS — E875 Hyperkalemia: Secondary | ICD-10-CM | POA: Diagnosis not present

## 2017-07-10 ENCOUNTER — Ambulatory Visit (INDEPENDENT_AMBULATORY_CARE_PROVIDER_SITE_OTHER): Payer: PRIVATE HEALTH INSURANCE

## 2017-07-10 DIAGNOSIS — L501 Idiopathic urticaria: Secondary | ICD-10-CM | POA: Diagnosis not present

## 2017-07-10 DIAGNOSIS — L508 Other urticaria: Secondary | ICD-10-CM

## 2017-07-13 DIAGNOSIS — E875 Hyperkalemia: Secondary | ICD-10-CM | POA: Diagnosis not present

## 2017-07-13 DIAGNOSIS — I10 Essential (primary) hypertension: Secondary | ICD-10-CM | POA: Diagnosis not present

## 2017-07-13 DIAGNOSIS — E1122 Type 2 diabetes mellitus with diabetic chronic kidney disease: Secondary | ICD-10-CM | POA: Diagnosis not present

## 2017-08-07 ENCOUNTER — Ambulatory Visit (INDEPENDENT_AMBULATORY_CARE_PROVIDER_SITE_OTHER): Payer: PRIVATE HEALTH INSURANCE

## 2017-08-07 DIAGNOSIS — L501 Idiopathic urticaria: Secondary | ICD-10-CM

## 2017-08-20 ENCOUNTER — Other Ambulatory Visit: Payer: Self-pay | Admitting: Allergy & Immunology

## 2017-09-04 ENCOUNTER — Ambulatory Visit (INDEPENDENT_AMBULATORY_CARE_PROVIDER_SITE_OTHER): Payer: PRIVATE HEALTH INSURANCE | Admitting: *Deleted

## 2017-09-04 DIAGNOSIS — L501 Idiopathic urticaria: Secondary | ICD-10-CM | POA: Diagnosis not present

## 2017-10-02 ENCOUNTER — Ambulatory Visit (INDEPENDENT_AMBULATORY_CARE_PROVIDER_SITE_OTHER): Payer: PRIVATE HEALTH INSURANCE

## 2017-10-02 DIAGNOSIS — L501 Idiopathic urticaria: Secondary | ICD-10-CM

## 2017-10-04 DIAGNOSIS — J441 Chronic obstructive pulmonary disease with (acute) exacerbation: Secondary | ICD-10-CM | POA: Diagnosis not present

## 2017-10-08 DIAGNOSIS — J42 Unspecified chronic bronchitis: Secondary | ICD-10-CM | POA: Diagnosis not present

## 2017-10-12 DIAGNOSIS — E1129 Type 2 diabetes mellitus with other diabetic kidney complication: Secondary | ICD-10-CM | POA: Diagnosis not present

## 2017-10-19 ENCOUNTER — Other Ambulatory Visit (HOSPITAL_COMMUNITY): Payer: Self-pay | Admitting: Internal Medicine

## 2017-10-19 ENCOUNTER — Ambulatory Visit (HOSPITAL_COMMUNITY)
Admission: RE | Admit: 2017-10-19 | Discharge: 2017-10-19 | Disposition: A | Discharge status: Home / Self Care | Payer: Medicare HMO | Source: Ambulatory Visit | Attending: Internal Medicine | Admitting: Internal Medicine

## 2017-10-19 DIAGNOSIS — Z23 Encounter for immunization: Secondary | ICD-10-CM | POA: Diagnosis not present

## 2017-10-19 DIAGNOSIS — J449 Chronic obstructive pulmonary disease, unspecified: Secondary | ICD-10-CM | POA: Diagnosis not present

## 2017-10-19 DIAGNOSIS — E1129 Type 2 diabetes mellitus with other diabetic kidney complication: Secondary | ICD-10-CM | POA: Diagnosis not present

## 2017-10-19 DIAGNOSIS — I517 Cardiomegaly: Secondary | ICD-10-CM | POA: Diagnosis not present

## 2017-10-19 DIAGNOSIS — R05 Cough: Secondary | ICD-10-CM | POA: Insufficient documentation

## 2017-10-19 DIAGNOSIS — R0602 Shortness of breath: Secondary | ICD-10-CM | POA: Diagnosis not present

## 2017-10-22 ENCOUNTER — Other Ambulatory Visit: Payer: Self-pay

## 2017-10-22 ENCOUNTER — Emergency Department (HOSPITAL_COMMUNITY)
Admission: EM | Admit: 2017-10-22 | Discharge: 2017-10-22 | Disposition: A | Discharge status: Home / Self Care | Payer: Medicare HMO | Attending: Emergency Medicine | Admitting: Emergency Medicine

## 2017-10-22 ENCOUNTER — Emergency Department (HOSPITAL_COMMUNITY): Payer: Medicare HMO

## 2017-10-22 ENCOUNTER — Encounter (HOSPITAL_COMMUNITY): Payer: Self-pay

## 2017-10-22 DIAGNOSIS — S0591XA Unspecified injury of right eye and orbit, initial encounter: Secondary | ICD-10-CM | POA: Insufficient documentation

## 2017-10-22 DIAGNOSIS — Z87891 Personal history of nicotine dependence: Secondary | ICD-10-CM | POA: Insufficient documentation

## 2017-10-22 DIAGNOSIS — Y998 Other external cause status: Secondary | ICD-10-CM | POA: Insufficient documentation

## 2017-10-22 DIAGNOSIS — Z8673 Personal history of transient ischemic attack (TIA), and cerebral infarction without residual deficits: Secondary | ICD-10-CM | POA: Insufficient documentation

## 2017-10-22 DIAGNOSIS — Y92410 Unspecified street and highway as the place of occurrence of the external cause: Secondary | ICD-10-CM | POA: Insufficient documentation

## 2017-10-22 DIAGNOSIS — R58 Hemorrhage, not elsewhere classified: Secondary | ICD-10-CM | POA: Diagnosis not present

## 2017-10-22 DIAGNOSIS — I10 Essential (primary) hypertension: Secondary | ICD-10-CM | POA: Insufficient documentation

## 2017-10-22 DIAGNOSIS — Z79899 Other long term (current) drug therapy: Secondary | ICD-10-CM | POA: Diagnosis not present

## 2017-10-22 DIAGNOSIS — S0231XA Fracture of orbital floor, right side, initial encounter for closed fracture: Secondary | ICD-10-CM | POA: Diagnosis not present

## 2017-10-22 DIAGNOSIS — Y9389 Activity, other specified: Secondary | ICD-10-CM | POA: Diagnosis not present

## 2017-10-22 DIAGNOSIS — R000 Tachycardia, unspecified: Secondary | ICD-10-CM | POA: Diagnosis not present

## 2017-10-22 DIAGNOSIS — S0590XA Unspecified injury of unspecified eye and orbit, initial encounter: Secondary | ICD-10-CM | POA: Diagnosis not present

## 2017-10-22 DIAGNOSIS — E119 Type 2 diabetes mellitus without complications: Secondary | ICD-10-CM | POA: Diagnosis not present

## 2017-10-22 DIAGNOSIS — Z7982 Long term (current) use of aspirin: Secondary | ICD-10-CM | POA: Insufficient documentation

## 2017-10-22 DIAGNOSIS — Z794 Long term (current) use of insulin: Secondary | ICD-10-CM | POA: Diagnosis not present

## 2017-10-22 DIAGNOSIS — W19XXXA Unspecified fall, initial encounter: Secondary | ICD-10-CM | POA: Diagnosis not present

## 2017-10-22 DIAGNOSIS — Z23 Encounter for immunization: Secondary | ICD-10-CM | POA: Insufficient documentation

## 2017-10-22 DIAGNOSIS — Z96643 Presence of artificial hip joint, bilateral: Secondary | ICD-10-CM | POA: Insufficient documentation

## 2017-10-22 DIAGNOSIS — S199XXA Unspecified injury of neck, initial encounter: Secondary | ICD-10-CM | POA: Diagnosis not present

## 2017-10-22 DIAGNOSIS — J449 Chronic obstructive pulmonary disease, unspecified: Secondary | ICD-10-CM | POA: Insufficient documentation

## 2017-10-22 LAB — ETHANOL: ALCOHOL ETHYL (B): 93 mg/dL — AB (ref <10)

## 2017-10-22 LAB — CBC WITH DIFFERENTIAL/PLATELET
BASOS ABS: 0 10*3/uL (ref 0.0–0.1)
Basophils Relative: 0 %
Eosinophils Absolute: 0 10*3/uL (ref 0.0–0.7)
Eosinophils Relative: 0 %
HEMATOCRIT: 36.1 % — AB (ref 39.0–52.0)
HEMOGLOBIN: 11.6 g/dL — AB (ref 13.0–17.0)
LYMPHS PCT: 18 %
Lymphs Abs: 2.2 10*3/uL (ref 0.7–4.0)
MCH: 32 pg (ref 26.0–34.0)
MCHC: 32.1 g/dL (ref 30.0–36.0)
MCV: 99.4 fL (ref 78.0–100.0)
Monocytes Absolute: 0.6 10*3/uL (ref 0.1–1.0)
Monocytes Relative: 5 %
NEUTROS ABS: 9.1 10*3/uL — AB (ref 1.7–7.7)
NEUTROS PCT: 77 %
Platelets: 207 10*3/uL (ref 150–400)
RBC: 3.63 MIL/uL — AB (ref 4.22–5.81)
RDW: 20.2 % — ABNORMAL HIGH (ref 11.5–15.5)
WBC: 11.9 10*3/uL — AB (ref 4.0–10.5)

## 2017-10-22 LAB — COMPREHENSIVE METABOLIC PANEL
ALBUMIN: 3.5 g/dL (ref 3.5–5.0)
ALT: 50 U/L — AB (ref 0–44)
AST: 56 U/L — AB (ref 15–41)
Alkaline Phosphatase: 98 U/L (ref 38–126)
Anion gap: 15 (ref 5–15)
BILIRUBIN TOTAL: 0.8 mg/dL (ref 0.3–1.2)
BUN: 22 mg/dL (ref 8–23)
CO2: 23 mmol/L (ref 22–32)
CREATININE: 1.1 mg/dL (ref 0.61–1.24)
Calcium: 8.8 mg/dL — ABNORMAL LOW (ref 8.9–10.3)
Chloride: 105 mmol/L (ref 98–111)
GFR calc Af Amer: >60 mL/min (ref >60)
GLUCOSE: 99 mg/dL (ref 70–99)
Potassium: 4 mmol/L (ref 3.5–5.1)
Sodium: 143 mmol/L (ref 135–145)
TOTAL PROTEIN: 6.3 g/dL — AB (ref 6.5–8.1)

## 2017-10-22 LAB — I-STAT CHEM 8, ED
BUN: 22 mg/dL (ref 8–23)
CALCIUM ION: 1.13 mmol/L — AB (ref 1.15–1.40)
Chloride: 103 mmol/L (ref 98–111)
Creatinine, Ser: 1.3 mg/dL — ABNORMAL HIGH (ref 0.61–1.24)
GLUCOSE: 96 mg/dL (ref 70–99)
HCT: 37 % — ABNORMAL LOW (ref 39.0–52.0)
Hemoglobin: 12.6 g/dL — ABNORMAL LOW (ref 13.0–17.0)
Potassium: 4 mmol/L (ref 3.5–5.1)
Sodium: 140 mmol/L (ref 135–145)
TCO2: 23 mmol/L (ref 22–32)

## 2017-10-22 MED ORDER — TETANUS-DIPHTH-ACELL PERTUSSIS 5-2.5-18.5 LF-MCG/0.5 IM SUSP
0.5000 mL | Freq: Once | INTRAMUSCULAR | Status: AC
Start: 2017-10-22 — End: 2017-10-22
  Administered 2017-10-22: 0.5 mL via INTRAMUSCULAR
  Filled 2017-10-22: qty 0.5

## 2017-10-22 MED ORDER — HYDROMORPHONE HCL 1 MG/ML IJ SOLN
0.5000 mg | Freq: Once | INTRAMUSCULAR | Status: AC
  Administered 2017-10-22: 0.5 mg via INTRAVENOUS
  Filled 2017-10-22: qty 1

## 2017-10-22 MED ORDER — ERYTHROMYCIN 5 MG/GM OP OINT
TOPICAL_OINTMENT | Freq: Once | OPHTHALMIC | Status: AC
  Administered 2017-10-22: 1 via OPHTHALMIC
  Filled 2017-10-22: qty 3.5

## 2017-10-22 MED ORDER — HYDROMORPHONE HCL 1 MG/ML IJ SOLN
1.0000 mg | Freq: Once | INTRAMUSCULAR | Status: AC
  Administered 2017-10-22: 1 mg via INTRAVENOUS
  Filled 2017-10-22: qty 1

## 2017-10-22 MED ORDER — OXYCODONE-ACETAMINOPHEN 5-325 MG PO TABS: 1.0000 | ORAL_TABLET | Freq: Four times a day (QID) | ORAL | 0 refills | Status: AC | PRN

## 2017-10-22 NOTE — ED Notes (Signed)
Patient in CT
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
## 2017-10-22 NOTE — Discharge Instructions (Addendum)
Follow up with dr. Allena KatzPatel at 8;30am tomorrow.   Do not eat or drink anything after midnight except take your blood pressure medicines as prescribed with small sips of water

## 2017-10-22 NOTE — ED Notes (Signed)
Abrasions to right elbow and left hand cleansed and  telfa applied with gauze wrap.

## 2017-10-23 ENCOUNTER — Encounter (HOSPITAL_COMMUNITY): Admission: RE | Payer: Self-pay | Source: Ambulatory Visit

## 2017-10-23 ENCOUNTER — Ambulatory Visit (HOSPITAL_COMMUNITY): Admission: RE | Admit: 2017-10-23 | Payer: Medicare HMO | Source: Ambulatory Visit | Admitting: Ophthalmology

## 2017-10-23 DIAGNOSIS — H1131 Conjunctival hemorrhage, right eye: Secondary | ICD-10-CM | POA: Diagnosis not present

## 2017-10-23 DIAGNOSIS — S0521XA Ocular laceration and rupture with prolapse or loss of intraocular tissue, right eye, initial encounter: Secondary | ICD-10-CM | POA: Diagnosis not present

## 2017-10-23 DIAGNOSIS — H11432 Conjunctival hyperemia, left eye: Secondary | ICD-10-CM | POA: Diagnosis not present

## 2017-10-23 DIAGNOSIS — H31411 Hemorrhagic choroidal detachment, right eye: Secondary | ICD-10-CM | POA: Diagnosis not present

## 2017-10-23 DIAGNOSIS — H4311 Vitreous hemorrhage, right eye: Secondary | ICD-10-CM | POA: Diagnosis not present

## 2017-10-23 DIAGNOSIS — S0511XA Contusion of eyeball and orbital tissues, right eye, initial encounter: Secondary | ICD-10-CM | POA: Diagnosis not present

## 2017-10-23 SURGERY — REPAIR, RUPTURE, GLOBE
Anesthesia: Choice | Laterality: Right
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
## 2017-10-30 ENCOUNTER — Ambulatory Visit (INDEPENDENT_AMBULATORY_CARE_PROVIDER_SITE_OTHER): Payer: PRIVATE HEALTH INSURANCE | Admitting: *Deleted

## 2017-10-30 DIAGNOSIS — L501 Idiopathic urticaria: Secondary | ICD-10-CM | POA: Diagnosis not present

## 2017-10-31 DIAGNOSIS — H4311 Vitreous hemorrhage, right eye: Secondary | ICD-10-CM | POA: Diagnosis not present

## 2017-10-31 DIAGNOSIS — H2101 Hyphema, right eye: Secondary | ICD-10-CM | POA: Diagnosis not present

## 2017-10-31 DIAGNOSIS — H33051 Total retinal detachment, right eye: Secondary | ICD-10-CM | POA: Diagnosis not present

## 2017-10-31 DIAGNOSIS — H31411 Hemorrhagic choroidal detachment, right eye: Secondary | ICD-10-CM | POA: Diagnosis not present

## 2017-10-31 DIAGNOSIS — S0521XD Ocular laceration and rupture with prolapse or loss of intraocular tissue, right eye, subsequent encounter: Secondary | ICD-10-CM | POA: Diagnosis not present

## 2017-11-01 NOTE — Progress Notes (Signed)
I called Mr Lucas Clark., no answer, I left the following information on voice mail If you still take Glipizide, do not take it today. Do not take any pills for diabetes in am.  Call pre op for instructions regarding what medications to take - mediation list is not updated.  Arrive at  1300 NPO after midnight.   I instructed patient to check CBG after awaking and every 2 hours until arrival  to the hospital.  I Instructed patient if CBG is less than 70 to drink 1/2 cup of a clear juice. Recheck CBG in 15 minutes then call pre- op desk at [phone] for further instructions. If scheduled to receive Insulin, do not take Insulin

## 2017-11-02 ENCOUNTER — Ambulatory Visit (HOSPITAL_COMMUNITY)
Admission: RE | Admit: 2017-11-02 | Discharge: 2017-11-02 | Disposition: A | Discharge status: Home / Self Care | Payer: Medicare HMO | Source: Ambulatory Visit | Attending: Ophthalmology | Admitting: Ophthalmology

## 2017-11-02 ENCOUNTER — Encounter (HOSPITAL_COMMUNITY)
Admission: RE | Disposition: A | Discharge status: Home / Self Care | Payer: Self-pay | Source: Ambulatory Visit | Attending: Ophthalmology

## 2017-11-02 ENCOUNTER — Ambulatory Visit (HOSPITAL_COMMUNITY): Payer: Medicare HMO | Admitting: Certified Registered Nurse Anesthetist

## 2017-11-02 ENCOUNTER — Encounter (HOSPITAL_COMMUNITY): Payer: Self-pay | Admitting: *Deleted

## 2017-11-02 ENCOUNTER — Other Ambulatory Visit: Payer: Self-pay

## 2017-11-02 DIAGNOSIS — G4730 Sleep apnea, unspecified: Secondary | ICD-10-CM | POA: Diagnosis not present

## 2017-11-02 DIAGNOSIS — H33051 Total retinal detachment, right eye: Secondary | ICD-10-CM | POA: Diagnosis not present

## 2017-11-02 DIAGNOSIS — J449 Chronic obstructive pulmonary disease, unspecified: Secondary | ICD-10-CM | POA: Insufficient documentation

## 2017-11-02 DIAGNOSIS — H31301 Unspecified choroidal hemorrhage, right eye: Secondary | ICD-10-CM | POA: Diagnosis not present

## 2017-11-02 DIAGNOSIS — Z794 Long term (current) use of insulin: Secondary | ICD-10-CM | POA: Diagnosis not present

## 2017-11-02 DIAGNOSIS — H31411 Hemorrhagic choroidal detachment, right eye: Secondary | ICD-10-CM | POA: Diagnosis not present

## 2017-11-02 DIAGNOSIS — Z87891 Personal history of nicotine dependence: Secondary | ICD-10-CM | POA: Insufficient documentation

## 2017-11-02 DIAGNOSIS — Z79899 Other long term (current) drug therapy: Secondary | ICD-10-CM | POA: Diagnosis not present

## 2017-11-02 DIAGNOSIS — E119 Type 2 diabetes mellitus without complications: Secondary | ICD-10-CM | POA: Diagnosis not present

## 2017-11-02 DIAGNOSIS — Z6841 Body Mass Index (BMI) 40.0 and over, adult: Secondary | ICD-10-CM | POA: Insufficient documentation

## 2017-11-02 DIAGNOSIS — I10 Essential (primary) hypertension: Secondary | ICD-10-CM | POA: Insufficient documentation

## 2017-11-02 DIAGNOSIS — H3321 Serous retinal detachment, right eye: Secondary | ICD-10-CM | POA: Diagnosis not present

## 2017-11-02 DIAGNOSIS — H2101 Hyphema, right eye: Secondary | ICD-10-CM | POA: Insufficient documentation

## 2017-11-02 DIAGNOSIS — N289 Disorder of kidney and ureter, unspecified: Secondary | ICD-10-CM | POA: Diagnosis not present

## 2017-11-02 HISTORY — PX: CHOROIDAL DRAINAGE: SHX5292

## 2017-11-02 HISTORY — PX: REPAIR OF COMPLEX TRACTION RETINAL DETACHMENT: SHX6217

## 2017-11-02 LAB — GLUCOSE, CAPILLARY
GLUCOSE-CAPILLARY: 105 mg/dL — AB (ref 70–99)
Glucose-Capillary: 138 mg/dL — ABNORMAL HIGH (ref 70–99)

## 2017-11-02 SURGERY — REPAIR, RETINAL DETACHMENT, COMPLEX
Anesthesia: General | Site: Eye | Laterality: Right

## 2017-11-02 MED ORDER — ROCURONIUM BROMIDE 50 MG/5ML IV SOSY
PREFILLED_SYRINGE | INTRAVENOUS | Status: DC | PRN
  Administered 2017-11-02: 50 mg via INTRAVENOUS

## 2017-11-02 MED ORDER — IPRATROPIUM BROMIDE 0.02 % IN SOLN
0.2500 mg | Freq: Once | RESPIRATORY_TRACT | Status: AC
  Administered 2017-11-02: 0.25 mg via RESPIRATORY_TRACT
  Filled 2017-11-02: qty 2.5

## 2017-11-02 MED ORDER — FENTANYL CITRATE (PF) 100 MCG/2ML IJ SOLN
INTRAMUSCULAR | Status: DC | PRN
  Administered 2017-11-02 (×2): 50 ug via INTRAVENOUS

## 2017-11-02 MED ORDER — HYALURONIDASE HUMAN 150 UNIT/ML IJ SOLN
INTRAMUSCULAR | Status: DC | PRN
  Administered 2017-11-02: 150 [IU] via INTRADERMAL

## 2017-11-02 MED ORDER — DEXAMETHASONE SODIUM PHOSPHATE 10 MG/ML IJ SOLN
INTRAMUSCULAR | Status: AC
  Filled 2017-11-02: qty 1

## 2017-11-02 MED ORDER — LIDOCAINE HCL 2 % IJ SOLN
INTRAMUSCULAR | Status: DC | PRN
  Administered 2017-11-02: 10 mL

## 2017-11-02 MED ORDER — FENTANYL CITRATE (PF) 100 MCG/2ML IJ SOLN
INTRAMUSCULAR | Status: AC
  Filled 2017-11-02: qty 2

## 2017-11-02 MED ORDER — MEPERIDINE HCL 50 MG/ML IJ SOLN: 6.2500 mg | INTRAMUSCULAR | Status: DC | PRN

## 2017-11-02 MED ORDER — DEXAMETHASONE SODIUM PHOSPHATE 10 MG/ML IJ SOLN
INTRAMUSCULAR | Status: DC | PRN
  Administered 2017-11-02: 10 mg via INTRAVENOUS

## 2017-11-02 MED ORDER — EPINEPHRINE PF 1 MG/ML IJ SOLN
INTRAOCULAR | Status: DC | PRN
  Administered 2017-11-02: 16:00:00
  Administered 2017-11-02: 500 mL

## 2017-11-02 MED ORDER — PHENYLEPHRINE HCL 2.5 % OP SOLN
1.0000 [drp] | OPHTHALMIC | Status: AC | PRN
  Administered 2017-11-02 (×3): 1 [drp] via OPHTHALMIC
  Filled 2017-11-02: qty 2

## 2017-11-02 MED ORDER — CEFAZOLIN SUBCONJUNCTIVAL INJECTION 100 MG/0.5 ML
200.0000 mg | INJECTION | SUBCONJUNCTIVAL | Status: AC
  Administered 2017-11-02: 100 mg via SUBCONJUNCTIVAL
  Filled 2017-11-02: qty 2

## 2017-11-02 MED ORDER — SUGAMMADEX SODIUM 200 MG/2ML IV SOLN
INTRAVENOUS | Status: DC | PRN
  Administered 2017-11-02: 300 mg via INTRAVENOUS

## 2017-11-02 MED ORDER — HYALURONIDASE HUMAN 150 UNIT/ML IJ SOLN
INTRAMUSCULAR | Status: AC
  Filled 2017-11-02: qty 1

## 2017-11-02 MED ORDER — HYPROMELLOSE (GONIOSCOPIC) 2.5 % OP SOLN
OPHTHALMIC | Status: AC
  Filled 2017-11-02: qty 15

## 2017-11-02 MED ORDER — SODIUM CHLORIDE 0.9 % IV SOLN
INTRAVENOUS | Status: DC | PRN
  Administered 2017-11-02: 25 ug/min via INTRAVENOUS

## 2017-11-02 MED ORDER — SODIUM CHLORIDE 0.9 % IV SOLN
INTRAVENOUS | Status: DC
  Administered 2017-11-02 (×2): via INTRAVENOUS

## 2017-11-02 MED ORDER — PROPOFOL 10 MG/ML IV BOLUS
INTRAVENOUS | Status: DC | PRN
  Administered 2017-11-02: 120 mg via INTRAVENOUS
  Administered 2017-11-02: 20 mg via INTRAVENOUS

## 2017-11-02 MED ORDER — LIDOCAINE HCL 2 % IJ SOLN
INTRAMUSCULAR | Status: AC
  Filled 2017-11-02: qty 20

## 2017-11-02 MED ORDER — PROMETHAZINE HCL 25 MG/ML IJ SOLN: 6.2500 mg | INTRAMUSCULAR | Status: DC | PRN

## 2017-11-02 MED ORDER — BUPIVACAINE HCL (PF) 0.75 % IJ SOLN
INTRAMUSCULAR | Status: DC | PRN
  Administered 2017-11-02: 10 mL

## 2017-11-02 MED ORDER — OFLOXACIN 0.3 % OP SOLN
1.0000 [drp] | OPHTHALMIC | Status: AC | PRN
  Administered 2017-11-02 (×3): 1 [drp] via OPHTHALMIC
  Filled 2017-11-02: qty 5

## 2017-11-02 MED ORDER — ESMOLOL HCL 100 MG/10ML IV SOLN
INTRAVENOUS | Status: DC | PRN
  Administered 2017-11-02 (×3): 20 ug via INTRAVENOUS
  Administered 2017-11-02 (×2): 10 ug via INTRAVENOUS

## 2017-11-02 MED ORDER — ONDANSETRON HCL 4 MG/2ML IJ SOLN
INTRAMUSCULAR | Status: DC | PRN
  Administered 2017-11-02: 4 mg via INTRAVENOUS

## 2017-11-02 MED ORDER — EPINEPHRINE PF 1 MG/ML IJ SOLN
INTRAMUSCULAR | Status: AC
  Filled 2017-11-02: qty 1

## 2017-11-02 MED ORDER — LIDOCAINE 2% (20 MG/ML) 5 ML SYRINGE
INTRAMUSCULAR | Status: DC | PRN
  Administered 2017-11-02: 40 mg via INTRAVENOUS

## 2017-11-02 MED ORDER — MIDAZOLAM HCL 2 MG/2ML IJ SOLN
INTRAMUSCULAR | Status: DC | PRN
  Administered 2017-11-02: 2 mg via INTRAVENOUS

## 2017-11-02 MED ORDER — BSS IO SOLN
INTRAOCULAR | Status: AC
  Filled 2017-11-02: qty 15

## 2017-11-02 MED ORDER — FENTANYL CITRATE (PF) 250 MCG/5ML IJ SOLN
INTRAMUSCULAR | Status: AC
  Filled 2017-11-02: qty 5

## 2017-11-02 MED ORDER — MIDAZOLAM HCL 2 MG/2ML IJ SOLN
INTRAMUSCULAR | Status: AC
  Filled 2017-11-02: qty 2

## 2017-11-02 MED ORDER — ONDANSETRON HCL 4 MG/2ML IJ SOLN
INTRAMUSCULAR | Status: AC
  Filled 2017-11-02: qty 2

## 2017-11-02 MED ORDER — SUGAMMADEX SODIUM 500 MG/5ML IV SOLN
INTRAVENOUS | Status: AC
  Filled 2017-11-02: qty 5

## 2017-11-02 MED ORDER — TOBRAMYCIN-DEXAMETHASONE 0.3-0.1 % OP OINT
TOPICAL_OINTMENT | OPHTHALMIC | Status: DC | PRN
  Administered 2017-11-02: 1 via OPHTHALMIC

## 2017-11-02 MED ORDER — DEXAMETHASONE SODIUM PHOSPHATE 10 MG/ML IJ SOLN
INTRAMUSCULAR | Status: DC | PRN
  Administered 2017-11-02: 10 mg

## 2017-11-02 MED ORDER — TOBRAMYCIN-DEXAMETHASONE 0.3-0.1 % OP OINT
TOPICAL_OINTMENT | OPHTHALMIC | Status: AC
  Filled 2017-11-02: qty 3.5

## 2017-11-02 MED ORDER — BSS PLUS IO SOLN
INTRAOCULAR | Status: AC
  Filled 2017-11-02: qty 500

## 2017-11-02 MED ORDER — CYCLOPENTOLATE HCL 1 % OP SOLN
1.0000 [drp] | OPHTHALMIC | Status: AC | PRN
  Administered 2017-11-02 (×3): 1 [drp] via OPHTHALMIC
  Filled 2017-11-02: qty 2

## 2017-11-02 MED ORDER — TROPICAMIDE 1 % OP SOLN
1.0000 [drp] | OPHTHALMIC | Status: AC | PRN
  Administered 2017-11-02 (×3): 1 [drp] via OPHTHALMIC
  Filled 2017-11-02: qty 15

## 2017-11-02 MED ORDER — BUPIVACAINE HCL (PF) 0.75 % IJ SOLN
INTRAMUSCULAR | Status: AC
  Filled 2017-11-02: qty 10

## 2017-11-02 MED ORDER — ROCURONIUM BROMIDE 50 MG/5ML IV SOSY
PREFILLED_SYRINGE | INTRAVENOUS | Status: AC
  Filled 2017-11-02: qty 5

## 2017-11-02 MED ORDER — BSS IO SOLN
INTRAOCULAR | Status: DC | PRN
  Administered 2017-11-02: 15 mL

## 2017-11-02 MED ORDER — MIDAZOLAM HCL 2 MG/2ML IJ SOLN: 0.5000 mg | Freq: Once | INTRAMUSCULAR | Status: DC | PRN

## 2017-11-02 MED ORDER — LIDOCAINE 2% (20 MG/ML) 5 ML SYRINGE
INTRAMUSCULAR | Status: AC
  Filled 2017-11-02: qty 5

## 2017-11-02 MED ORDER — FENTANYL CITRATE (PF) 100 MCG/2ML IJ SOLN
25.0000 ug | INTRAMUSCULAR | Status: DC | PRN
  Administered 2017-11-02 (×4): 25 ug via INTRAVENOUS

## 2017-11-02 SURGICAL SUPPLY — 94 items
APL SRG 3 HI ABS STRL LF PLS (MISCELLANEOUS) ×2
APPLICATOR COTTON TIP 6IN STRL (MISCELLANEOUS) ×4 IMPLANT
APPLICATOR DR MATTHEWS STRL (MISCELLANEOUS) ×4 IMPLANT
BLADE KERATOME 2.75 (BLADE) ×2 IMPLANT
BLADE KERATOME 2.75MM (BLADE) ×1
BLADE MINI 60D BLUE (BLADE) ×4 IMPLANT
BLADE MINI RND TIP GREEN BEAV (BLADE) IMPLANT
BLADE MVR KNIFE 20G (BLADE) ×3 IMPLANT
BLADE STAB KNIFE 15DEG (BLADE) ×3 IMPLANT
CANNULA ANT CHAM MAIN (OPHTHALMIC RELATED) ×3 IMPLANT
CANNULA ANT/CHMB 27G (MISCELLANEOUS) IMPLANT
CANNULA ANT/CHMB 27GA (MISCELLANEOUS) ×4 IMPLANT
CANNULA DUAL BORE 23G (CANNULA) IMPLANT
CANNULA DUALBORE 25G (CANNULA) ×4 IMPLANT
CANNULA FLEX TIP 25G (CANNULA) ×6 IMPLANT
CANNULA VLV SOFT TIP 25G (OPHTHALMIC) ×1 IMPLANT
CANNULA VLV SOFT TIP 25GA (OPHTHALMIC) ×4 IMPLANT
CAUTERY EYE LOW TEMP 1300F FIN (OPHTHALMIC RELATED) IMPLANT
CLOSURE STERI-STRIP 1/2X4 (GAUZE/BANDAGES/DRESSINGS) ×1
CLSR STERI-STRIP ANTIMIC 1/2X4 (GAUZE/BANDAGES/DRESSINGS) ×3 IMPLANT
CORDS BIPOLAR (ELECTRODE) ×3 IMPLANT
COVER MAYO STAND STRL (DRAPES) ×3 IMPLANT
COVER SURGICAL LIGHT HANDLE (MISCELLANEOUS) ×4 IMPLANT
COVER WAND RF STERILE (DRAPES) ×4 IMPLANT
DRAPE HALF SHEET 40X57 (DRAPES) ×4 IMPLANT
DRAPE INCISE 51X51 W/FILM STRL (DRAPES) ×3 IMPLANT
DRAPE RETRACTOR (MISCELLANEOUS) ×4 IMPLANT
ERASER HMR WETFIELD 23G BP (MISCELLANEOUS) IMPLANT
FILTER BLUE MILLIPORE (MISCELLANEOUS) IMPLANT
FILTER STRAW FLUID ASPIR (MISCELLANEOUS) IMPLANT
FORCEPS ECKARDT ILM 25G SERR (OPHTHALMIC RELATED) ×3 IMPLANT
FORCEPS GRIESHABER 23G SERR (INSTRUMENTS) ×3 IMPLANT
FORCEPS GRIESHABER ILM 25G A (INSTRUMENTS) ×3 IMPLANT
GAS AUTO FILL CONSTEL (OPHTHALMIC)
GAS AUTO FILL CONSTELLATION (OPHTHALMIC) IMPLANT
GAS OPHTHALMIC (MISCELLANEOUS) IMPLANT
GLOVE ECLIPSE 7.5 STRL STRAW (GLOVE) ×4 IMPLANT
GOWN STRL REUS W/ TWL LRG LVL3 (GOWN DISPOSABLE) ×6 IMPLANT
GOWN STRL REUS W/TWL LRG LVL3 (GOWN DISPOSABLE) ×16
KIT BASIN OR (CUSTOM PROCEDURE TRAY) ×4 IMPLANT
KIT PERFLUORON PROCEDURE 5ML (MISCELLANEOUS) ×3 IMPLANT
KIT TURNOVER KIT B (KITS) ×4 IMPLANT
KNIFE CRESCENT 2.5 55 ANG (BLADE) ×3 IMPLANT
LENS BIOM SUPER VIEW SET DISP (OPHTHALMIC RELATED) ×7 IMPLANT
MICROPICK 25G (MISCELLANEOUS)
NDL 18GX1X1/2 (RX/OR ONLY) (NEEDLE) ×1 IMPLANT
NDL 25GX 5/8IN NON SAFETY (NEEDLE) ×1 IMPLANT
NDL FILTER BLUNT 18X1 1/2 (NEEDLE) ×1 IMPLANT
NDL HYPO 25GX1X1/2 BEV (NEEDLE) IMPLANT
NDL HYPO 30X.5 LL (NEEDLE) ×2 IMPLANT
NDL RETROBULBAR 25GX1.5 (NEEDLE) ×1 IMPLANT
NEEDLE 18GX1X1/2 (RX/OR ONLY) (NEEDLE) ×4 IMPLANT
NEEDLE 25GX 5/8IN NON SAFETY (NEEDLE) ×4 IMPLANT
NEEDLE FILTER BLUNT 18X 1/2SAF (NEEDLE) ×2
NEEDLE FILTER BLUNT 18X1 1/2 (NEEDLE) ×2 IMPLANT
NEEDLE HYPO 25GX1X1/2 BEV (NEEDLE) IMPLANT
NEEDLE HYPO 30X.5 LL (NEEDLE) ×8 IMPLANT
NEEDLE RETROBULBAR 25GX1.5 (NEEDLE) IMPLANT
NS IRRIG 1000ML POUR BTL (IV SOLUTION) ×4 IMPLANT
OIL SILICONE OPHTHALMIC ADAPTO (Ophthalmic Related) ×3 IMPLANT
PACK FRAGMATOME (OPHTHALMIC) IMPLANT
PACK VITRECTOMY CUSTOM (CUSTOM PROCEDURE TRAY) ×4 IMPLANT
PAD ARMBOARD 7.5X6 YLW CONV (MISCELLANEOUS) ×8 IMPLANT
PAK PIK VITRECTOMY CVS 25GA (OPHTHALMIC) ×4 IMPLANT
PAK VITRECTOMY PIK 25 GA (OPHTHALMIC RELATED) ×2 IMPLANT
PENCIL BIPOLAR 25GA STR DISP (OPHTHALMIC RELATED) ×3 IMPLANT
PIC ILLUMINATED 25G (OPHTHALMIC) ×4
PICK MICROPICK 25G (MISCELLANEOUS) IMPLANT
PIK ILLUMINATED 25G (OPHTHALMIC) ×1 IMPLANT
PROBE LASER ILLUM FLEX CVD 25G (OPHTHALMIC) ×3 IMPLANT
REPL STRA BRUSH NDL (NEEDLE) IMPLANT
REPL STRA BRUSH NEEDLE (NEEDLE) IMPLANT
RESERVOIR BACK FLUSH (MISCELLANEOUS) IMPLANT
RETRACTOR IRIS FLEX 25G GRIESH (INSTRUMENTS) IMPLANT
ROLLS DENTAL (MISCELLANEOUS) ×6 IMPLANT
SCRAPER DIAMOND 25GA (OPHTHALMIC RELATED) IMPLANT
SET FLUID INJECTOR (SET/KITS/TRAYS/PACK) IMPLANT
SET INJECTOR OIL FLUID CONSTEL (OPHTHALMIC) ×3 IMPLANT
SHEET MEDIUM DRAPE 40X70 STRL (DRAPES) ×4 IMPLANT
SOLUTION ANTI FOG 6CC (MISCELLANEOUS) ×4 IMPLANT
STOCKINETTE IMPERVIOUS 9X36 MD (GAUZE/BANDAGES/DRESSINGS) ×8 IMPLANT
STOPCOCK 4 WAY LG BORE MALE ST (IV SETS) IMPLANT
SUT ETHILON 5.0 S-24 (SUTURE) ×1 IMPLANT
SUT SILK 2 0 (SUTURE)
SUT SILK 2-0 18XBRD TIE 12 (SUTURE) ×1 IMPLANT
SUT VICRYL 7 0 TG140 8 (SUTURE) ×1 IMPLANT
SUT VICRYL 8 0 TG140 8 (SUTURE) ×3 IMPLANT
SYR 10ML LL (SYRINGE) IMPLANT
SYR 20CC LL (SYRINGE) ×4 IMPLANT
SYR 5ML LL (SYRINGE) ×4 IMPLANT
SYR TB 1ML LUER SLIP (SYRINGE) IMPLANT
TOWEL OR 17X24 6PK STRL BLUE (TOWEL DISPOSABLE) ×8 IMPLANT
WATER STERILE IRR 1000ML POUR (IV SOLUTION) ×4 IMPLANT
WIPE INSTRUMENT VISIWIPE 73X73 (MISCELLANEOUS) IMPLANT

## 2017-11-02 NOTE — Transfer of Care (Signed)
Immediate Anesthesia Transfer of Care Note  Patient: Lucas Clark  Procedure(s) Performed: REPAIR OF COMPLEX TRACTION RETINAL DETACHMENT (Right Eye) CHOROIDAL DRAINAGE (Right Eye)  Patient Location: PACU  Anesthesia Type:General  Level of Consciousness: awake, alert  and oriented  Airway & Oxygen Therapy: Patient connected to nasal cannula oxygen  Post-op Assessment: Report given to RN and Post -op Vital signs reviewed and stable  Post vital signs: Reviewed and stable  Last Vitals:  Vitals Value Taken Time  BP 158/122 11/02/2017  7:17 PM  Temp    Pulse 108 11/02/2017  7:21 PM  Resp 19 11/02/2017  7:21 PM  SpO2 92 % 11/02/2017  7:21 PM  Vitals shown include unvalidated device data.  Last Pain:  Vitals:   11/02/17 1416  TempSrc:   PainSc: 0-No pain      Patients Stated Pain Goal: 0 (11/02/17 1416)  Complications: No apparent anesthesia complications

## 2017-11-02 NOTE — Brief Op Note (Signed)
11/02/2017  6:53 PM  PATIENT:  Lucas Clark  67 y.o. 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   Delay start of Pharmacological VTE agent (>24hrs) due to surgical blood loss or risk of bleeding: not applicable

## 2017-11-02 NOTE — Progress Notes (Signed)
Patient's sO2 on room air 87-89%.  Dr. Maple Hudson made aware.  Order received for 0.25mg  Atrovent.  Will administer and continue to monitor.  Patient placed back on 2L nasal cannula with sO2 at 94%.

## 2017-11-02 NOTE — Progress Notes (Signed)
Patient placed in chair, sitting upright with head down.  Patient reports no pain.  VSS.  SO2 92-94% on 2L nasal cannula.  Bilateral lung sounds clear but diminished.  Will continue to monitor.

## 2017-11-02 NOTE — Progress Notes (Signed)
Patient sO2 90-93% on room air after 0.25mg  Atrovent neb.  Dr. Maple Hudson okay to discharge patient home.

## 2017-11-02 NOTE — H&P (Signed)
  Date of examination:  10/31/17  Indication for surgery: Total retinal detachment, hyphema, choriodal hemorrhage right eye  Pertinent past medical history:  Past Medical History:  Diagnosis Date  . Asthma    "a touch"  . Blood transfusion ~ 1964  . Bronchitis    "a touch"  . COPD (chronic obstructive pulmonary disease) (HCC)   . High cholesterol   . Hypertension   . Restless legs   . Sleep apnea    last sleep study 2005. does not use cpap  . Stroke [facility]) ~2009   denies residual droopy r eye lid  . Type II diabetes mellitus (HCC)     Pertinent ocular history:  Right globe trauma with rupture  Pertinent family history: No family history on file.  General:  Healthy appearing patient in no distress.   Eyes:    Acuity OD LP    External: ecchymosis right orbit  Anterior segment: hyphema OD  Fundus: No view - total retinal detachment on B-scan     Impression:Total retinal detachment, hyphema, choriodal hemorrhage right eye  Plan: Complex retinal detachment repair, drainage of choroidal hemorrhage, anterior chamber washout right eye  Harrold Donath

## 2017-11-02 NOTE — Anesthesia Procedure Notes (Signed)
Procedure Name: Intubation Date/Time: 11/02/2017 4:20 PM Performed by: Pearson Grippe, CRNA Pre-anesthesia Checklist: Patient identified, Emergency Drugs available, Suction available and Patient being monitored Patient Re-evaluated:Patient Re-evaluated prior to induction Oxygen Delivery Method: Circle system utilized Preoxygenation: Pre-oxygenation with 100% oxygen Induction Type: IV induction Ventilation: Mask ventilation without difficulty and Oral airway inserted - appropriate to patient size Laryngoscope Size: Glidescope and 4 Grade View: Grade I Tube type: Oral Tube size: 7.5 mm Number of attempts: 1 Airway Equipment and Method: Stylet and Oral airway Placement Confirmation: ETT inserted through vocal cords under direct vision,  positive ETCO2 and breath sounds checked- equal and bilateral Secured at: 23 cm Tube secured with: Tape Dental Injury: Teeth and Oropharynx as per pre-operative assessment  Difficulty Due To: Difficulty was anticipated and Difficult Airway- due to large tongue Comments: Elective glidescope intubation with #4 blade

## 2017-11-02 NOTE — Discharge Instructions (Signed)
DO NOT SLEEP ON BACK, THE EYE PRESSURE CAN GO UP AND CAUSE VISION LOSS   SLEEP ON SIDE WITH NOSE TO PILLOW  DURING DAY KEEP FACE DOWN.  15 MINUTES EVERY 2 HOURS IT IS OK TO LOOK STRAIGHT AHEAD (USE BATHROOM, EAT, WALK, ETC.)  

## 2017-11-02 NOTE — Anesthesia Preprocedure Evaluation (Addendum)
Anesthesia Evaluation  Patient identified by MRN, date of birth, ID band Patient awake    Reviewed: Allergy & Precautions, NPO status , Patient's Chart, lab work & pertinent test results  History of Anesthesia Complications Negative for: history of anesthetic complications  Airway Mallampati: II  TM Distance: >3 FB Neck ROM: Full    Dental  (+) Edentulous Upper, Poor Dentition, Missing, Dental Advisory Given   Pulmonary sleep apnea (does not use CPAP) , COPD,  COPD inhaler, former smoker (quit 1993),    breath sounds clear to auscultation       Cardiovascular hypertension, Pt. on medications (-) angina Rhythm:Regular Rate:Normal     Neuro/Psych CVA (R eye symptoms), Residual Symptoms    GI/Hepatic Neg liver ROS, GERD  Controlled,  Endo/Other  diabetes (glu 105), Oral Hypoglycemic Agents, Insulin DependentMorbid obesity  Renal/GU Renal InsufficiencyRenal disease (creat 1.30)     Musculoskeletal   Abdominal (+) + obese,   Peds  Hematology negative hematology ROS (+)   Anesthesia Other Findings   Reproductive/Obstetrics                            Anesthesia Physical Anesthesia Plan  ASA: III  Anesthesia Plan: General   Post-op Pain Management:    Induction: Intravenous  PONV Risk Score and Plan: 2 and Ondansetron and Dexamethasone  Airway Management Planned: Oral ETT  Additional Equipment:   Intra-op Plan:   Post-operative Plan: Extubation in OR  Informed Consent: I have reviewed the patients History and Physical, chart, labs and discussed the procedure including the risks, benefits and alternatives for the proposed anesthesia with the patient or authorized representative who has indicated his/her understanding and acceptance.   Dental advisory given  Plan Discussed with: CRNA and Surgeon  Anesthesia Plan Comments: (Plan routine monitors, GETA)        Anesthesia Quick  Evaluation

## 2017-11-03 LAB — HEMOGLOBIN A1C
HEMOGLOBIN A1C: 6.3 % — AB (ref 4.8–5.6)
MEAN PLASMA GLUCOSE: 134 mg/dL

## 2017-11-05 ENCOUNTER — Encounter (HOSPITAL_COMMUNITY): Payer: Self-pay | Admitting: Ophthalmology

## 2017-11-06 DIAGNOSIS — R404 Transient alteration of awareness: Secondary | ICD-10-CM | POA: Diagnosis not present

## 2017-11-06 DIAGNOSIS — E1165 Type 2 diabetes mellitus with hyperglycemia: Secondary | ICD-10-CM | POA: Diagnosis not present

## 2017-11-06 DIAGNOSIS — R4020 Unspecified coma: Secondary | ICD-10-CM | POA: Diagnosis not present

## 2017-11-06 DIAGNOSIS — R092 Respiratory arrest: Secondary | ICD-10-CM | POA: Diagnosis not present

## 2017-11-08 NOTE — Op Note (Addendum)
Lucas Clark 11/02/17 Diagnosis: total retinal detachment, choroidal hemorrhage and hyphema right eye  Procedure: Complex retinal detachment repair, anterior chamber washout, drainage of choroidal hemorrhage with silicone oil tamponade Operative Eye:  right eye  Surgeon: Harrold Donath Estimated Blood Loss: minimal Specimens for Pathology:  None Complications: none    The  patient was prepped and draped in the usual fashion for ocular surgery on the  right eye .  A lid speculum was placed.  A conjunctival peritomy was made to reveal the trocar sites.  Infusion line and trocar was placed at the 6 o'clock position approximately 3.5 mm from the surgical limbus.  The infusion line was allowed to run and then clamped when placed at the cannula opening. The line was inserted and secured to the drape with an adhesive strip.  A paracentesis wound was made at 2 o'clock and a 27 gauge cannula was used to washout the anterior chamber until iris details were visible.  Active trocars/cannula were placed at the 10 and 2 o'clock positions approximately 3.5 mm from the surgical limbus. The cannula was visualized in the vitreous cavity.  The light pipe and vitreous cutter were inserted into the vitreous cavity and a core vitrectomy was performed.  Care was taken to carefully remove the hemorrhage from the posterior chamber.  An air/fluid exchange was performed and additional vitrectomy with infusion then turned on to evacuate the hemorrhage from the posterior chamber.  The IOL was not in the eye and must have been ejected at the time of initial globe rupture repair.  Once the retina was visualized and the total retinal detachment was visible, the temporal choroidal was drained with a 25 gauge trocar.  After adequate drainage, membranes and hemorrhage overlying the retina were carefully removed. 360 degree endolaser was applied to the ora serrata.  A retinotomy was made and subretinal fluid drained to flatten  the retina.  Silicone oil was placed in the eye to attain an 80% oil fill.   The trocars were sequentially removed and the conjunctiva was reapproximated to the limbus.  Normal intraocular pressure was noted by digital palpapation.  Subconjunctival injection of  Dexamethasone 4mg /68ml was placed.   The speculum and drapes were removed and the eye was patched with Polymixin/Bacitracin ophthalmic ointment. An eye shield was placed and the patient was transferred alert and conversant with stable vital signs to the post operative recovery area.  The patient tolerated the procedure well and no complications were noted.  Harrold Donath MD

## 2017-11-08 NOTE — Anesthesia Postprocedure Evaluation (Signed)
Anesthesia Post Note  Patient: Lucas Clark  Procedure(s) Performed: REPAIR OF COMPLEX TRACTION RETINAL DETACHMENT (Right Eye) CHOROIDAL DRAINAGE (Right Eye)     Patient location during evaluation: PACU Anesthesia Type: General Level of consciousness: awake and alert Pain management: pain level controlled Vital Signs Assessment: post-procedure vital signs reviewed and stable Respiratory status: spontaneous breathing, nonlabored ventilation, respiratory function stable and patient connected to nasal cannula oxygen Cardiovascular status: blood pressure returned to baseline and stable Postop Assessment: no apparent nausea or vomiting Anesthetic complications: no    Last Vitals:  Vitals:   11/02/17 2045 11/02/17 2100  BP: (!) 157/94 (!) 150/82  Pulse: (!) 101 99  Resp: 20 17  Temp:  36.8 C  SpO2: 98% 92%    Last Pain:  Vitals:   11/02/17 2100  TempSrc:   PainSc: 4                  Kofi Murrell

## 2017-11-30 ENCOUNTER — Ambulatory Visit: Payer: PRIVATE HEALTH INSURANCE

## 2017-11-30 DIAGNOSIS — 419620001 Death: Secondary | SNOMED CT | POA: Diagnosis not present

## 2017-11-30 DEATH — deceased
